# Patient Record
Sex: Male | Born: 1960 | Race: White | Hispanic: No | Marital: Married | Smoking: Current every day smoker
Health system: Southern US, Community
[De-identification: ages and names within clinical notes are randomized; demographics above are authoritative.]

## PROBLEM LIST (undated history)

## (undated) DIAGNOSIS — W19XXXA Unspecified fall, initial encounter: Secondary | ICD-10-CM

## (undated) DIAGNOSIS — I2510 Atherosclerotic heart disease of native coronary artery without angina pectoris: Secondary | ICD-10-CM

## (undated) DIAGNOSIS — K648 Other hemorrhoids: Secondary | ICD-10-CM

## (undated) DIAGNOSIS — Z91199 Patient's noncompliance with other medical treatment and regimen due to unspecified reason: Secondary | ICD-10-CM

## (undated) DIAGNOSIS — R569 Unspecified convulsions: Secondary | ICD-10-CM

## (undated) DIAGNOSIS — K3189 Other diseases of stomach and duodenum: Secondary | ICD-10-CM

## (undated) DIAGNOSIS — M545 Low back pain, unspecified: Secondary | ICD-10-CM

## (undated) DIAGNOSIS — R209 Unspecified disturbances of skin sensation: Secondary | ICD-10-CM

## (undated) DIAGNOSIS — R51 Headache: Secondary | ICD-10-CM

## (undated) DIAGNOSIS — F528 Other sexual dysfunction not due to a substance or known physiological condition: Secondary | ICD-10-CM

## (undated) DIAGNOSIS — F419 Anxiety disorder, unspecified: Secondary | ICD-10-CM

## (undated) DIAGNOSIS — IMO0002 Reserved for concepts with insufficient information to code with codable children: Secondary | ICD-10-CM

## (undated) DIAGNOSIS — R233 Spontaneous ecchymoses: Secondary | ICD-10-CM

## (undated) DIAGNOSIS — J309 Allergic rhinitis, unspecified: Secondary | ICD-10-CM

## (undated) DIAGNOSIS — R1013 Epigastric pain: Secondary | ICD-10-CM

## (undated) DIAGNOSIS — L219 Seborrheic dermatitis, unspecified: Secondary | ICD-10-CM

## (undated) DIAGNOSIS — R0789 Other chest pain: Secondary | ICD-10-CM

## (undated) DIAGNOSIS — K1120 Sialoadenitis, unspecified: Secondary | ICD-10-CM

## (undated) DIAGNOSIS — G4700 Insomnia, unspecified: Secondary | ICD-10-CM

## (undated) DIAGNOSIS — M76899 Other specified enthesopathies of unspecified lower limb, excluding foot: Secondary | ICD-10-CM

## (undated) DIAGNOSIS — F339 Major depressive disorder, recurrent, unspecified: Secondary | ICD-10-CM

## (undated) DIAGNOSIS — Z9119 Patient's noncompliance with other medical treatment and regimen: Secondary | ICD-10-CM

## (undated) DIAGNOSIS — M5412 Radiculopathy, cervical region: Secondary | ICD-10-CM

## (undated) DIAGNOSIS — G40909 Epilepsy, unspecified, not intractable, without status epilepticus: Secondary | ICD-10-CM

## (undated) DIAGNOSIS — F1010 Alcohol abuse, uncomplicated: Secondary | ICD-10-CM

## (undated) DIAGNOSIS — G510 Bell's palsy: Secondary | ICD-10-CM

## (undated) DIAGNOSIS — L723 Sebaceous cyst: Secondary | ICD-10-CM

## (undated) DIAGNOSIS — M999 Biomechanical lesion, unspecified: Secondary | ICD-10-CM

## (undated) DIAGNOSIS — R42 Dizziness and giddiness: Secondary | ICD-10-CM

## (undated) DIAGNOSIS — M6740 Ganglion, unspecified site: Secondary | ICD-10-CM

## (undated) DIAGNOSIS — I10 Essential (primary) hypertension: Secondary | ICD-10-CM

## (undated) DIAGNOSIS — M79609 Pain in unspecified limb: Secondary | ICD-10-CM

## (undated) DIAGNOSIS — F17200 Nicotine dependence, unspecified, uncomplicated: Secondary | ICD-10-CM

## (undated) HISTORY — DX: Sialoadenitis, unspecified: K11.20

## (undated) HISTORY — DX: Epigastric pain: R10.13

## (undated) HISTORY — DX: Radiculopathy, cervical region: M54.12

## (undated) HISTORY — DX: Pain in unspecified limb: M79.609

## (undated) HISTORY — DX: Dizziness and giddiness: R42

## (undated) HISTORY — DX: Unspecified disturbances of skin sensation: R20.9

## (undated) HISTORY — DX: Ganglion, unspecified site: M67.40

## (undated) HISTORY — DX: Reserved for concepts with insufficient information to code with codable children: IMO0002

## (undated) HISTORY — DX: Allergic rhinitis, unspecified: J30.9

## (undated) HISTORY — DX: Spontaneous ecchymoses: R23.3

## (undated) HISTORY — DX: Other sexual dysfunction not due to a substance or known physiological condition: F52.8

## (undated) HISTORY — DX: Other chest pain: R07.89

## (undated) HISTORY — DX: Epilepsy, unspecified, not intractable, without status epilepticus: G40.909

## (undated) HISTORY — DX: Sebaceous cyst: L72.3

## (undated) HISTORY — DX: Alcohol abuse, uncomplicated: F10.10

## (undated) HISTORY — DX: Biomechanical lesion, unspecified: M99.9

## (undated) HISTORY — DX: Patient's noncompliance with other medical treatment and regimen: Z91.19

## (undated) HISTORY — DX: Major depressive disorder, recurrent, unspecified: F33.9

## (undated) HISTORY — DX: Bell's palsy: G51.0

## (undated) HISTORY — PX: NECK SURGERY: SHX720

## (undated) HISTORY — DX: Other diseases of stomach and duodenum: K31.89

## (undated) HISTORY — DX: Headache: R51

## (undated) HISTORY — DX: Seborrheic dermatitis, unspecified: L21.9

## (undated) HISTORY — DX: Unspecified fall, initial encounter: W19.XXXA

## (undated) HISTORY — DX: Low back pain: M54.5

## (undated) HISTORY — DX: Nicotine dependence, unspecified, uncomplicated: F17.200

## (undated) HISTORY — DX: Low back pain, unspecified: M54.50

## (undated) HISTORY — DX: Patient's noncompliance with other medical treatment and regimen due to unspecified reason: Z91.199

## (undated) HISTORY — DX: Other hemorrhoids: K64.8

## (undated) HISTORY — DX: Other specified enthesopathies of unspecified lower limb, excluding foot: M76.899

---

## 1994-03-19 HISTORY — PX: CHOLECYSTECTOMY: SHX55

## 2007-03-20 DIAGNOSIS — I219 Acute myocardial infarction, unspecified: Secondary | ICD-10-CM

## 2007-04-07 ENCOUNTER — Ambulatory Visit: Payer: Self-pay | Admitting: Thoracic Surgery (Cardiothoracic Vascular Surgery)

## 2007-04-08 ENCOUNTER — Encounter (INDEPENDENT_AMBULATORY_CARE_PROVIDER_SITE_OTHER): Payer: Self-pay | Admitting: Cardiovascular Disease

## 2007-04-08 ENCOUNTER — Inpatient Hospital Stay (HOSPITAL_COMMUNITY): Admission: EM | Admit: 2007-04-08 | Discharge: 2007-04-14 | Payer: Self-pay | Admitting: Emergency Medicine

## 2007-04-08 ENCOUNTER — Encounter: Payer: Self-pay | Admitting: Thoracic Surgery (Cardiothoracic Vascular Surgery)

## 2007-04-11 HISTORY — PX: CORONARY ARTERY BYPASS GRAFT: SHX141

## 2007-04-17 HISTORY — PX: CARDIAC CATHETERIZATION: SHX172

## 2007-04-28 ENCOUNTER — Encounter: Admission: RE | Admit: 2007-04-28 | Discharge: 2007-04-28 | Payer: Self-pay | Admitting: Cardiovascular Disease

## 2007-05-09 ENCOUNTER — Encounter
Admission: RE | Admit: 2007-05-09 | Discharge: 2007-05-09 | Payer: Self-pay | Admitting: Thoracic Surgery (Cardiothoracic Vascular Surgery)

## 2007-05-09 ENCOUNTER — Ambulatory Visit: Payer: Self-pay | Admitting: Thoracic Surgery (Cardiothoracic Vascular Surgery)

## 2008-02-27 HISTORY — PX: COLONOSCOPY: SHX174

## 2008-07-17 HISTORY — PX: OTHER SURGICAL HISTORY: SHX169

## 2008-07-23 ENCOUNTER — Inpatient Hospital Stay (HOSPITAL_COMMUNITY): Admission: EM | Admit: 2008-07-23 | Discharge: 2008-07-29 | Payer: Self-pay | Admitting: Emergency Medicine

## 2009-10-01 IMAGING — CR DG CHEST 1V PORT
1 series · 1 of 1 positions shown · non-contrast
Comparison: Portable chest x-ray yesterday [id number].

CLINICAL DATA: Postop CABG. Chest tube removal.

PORTABLE CHEST - 1 VIEW  [DATE]/3110 1011 hours:

[view not recorded]
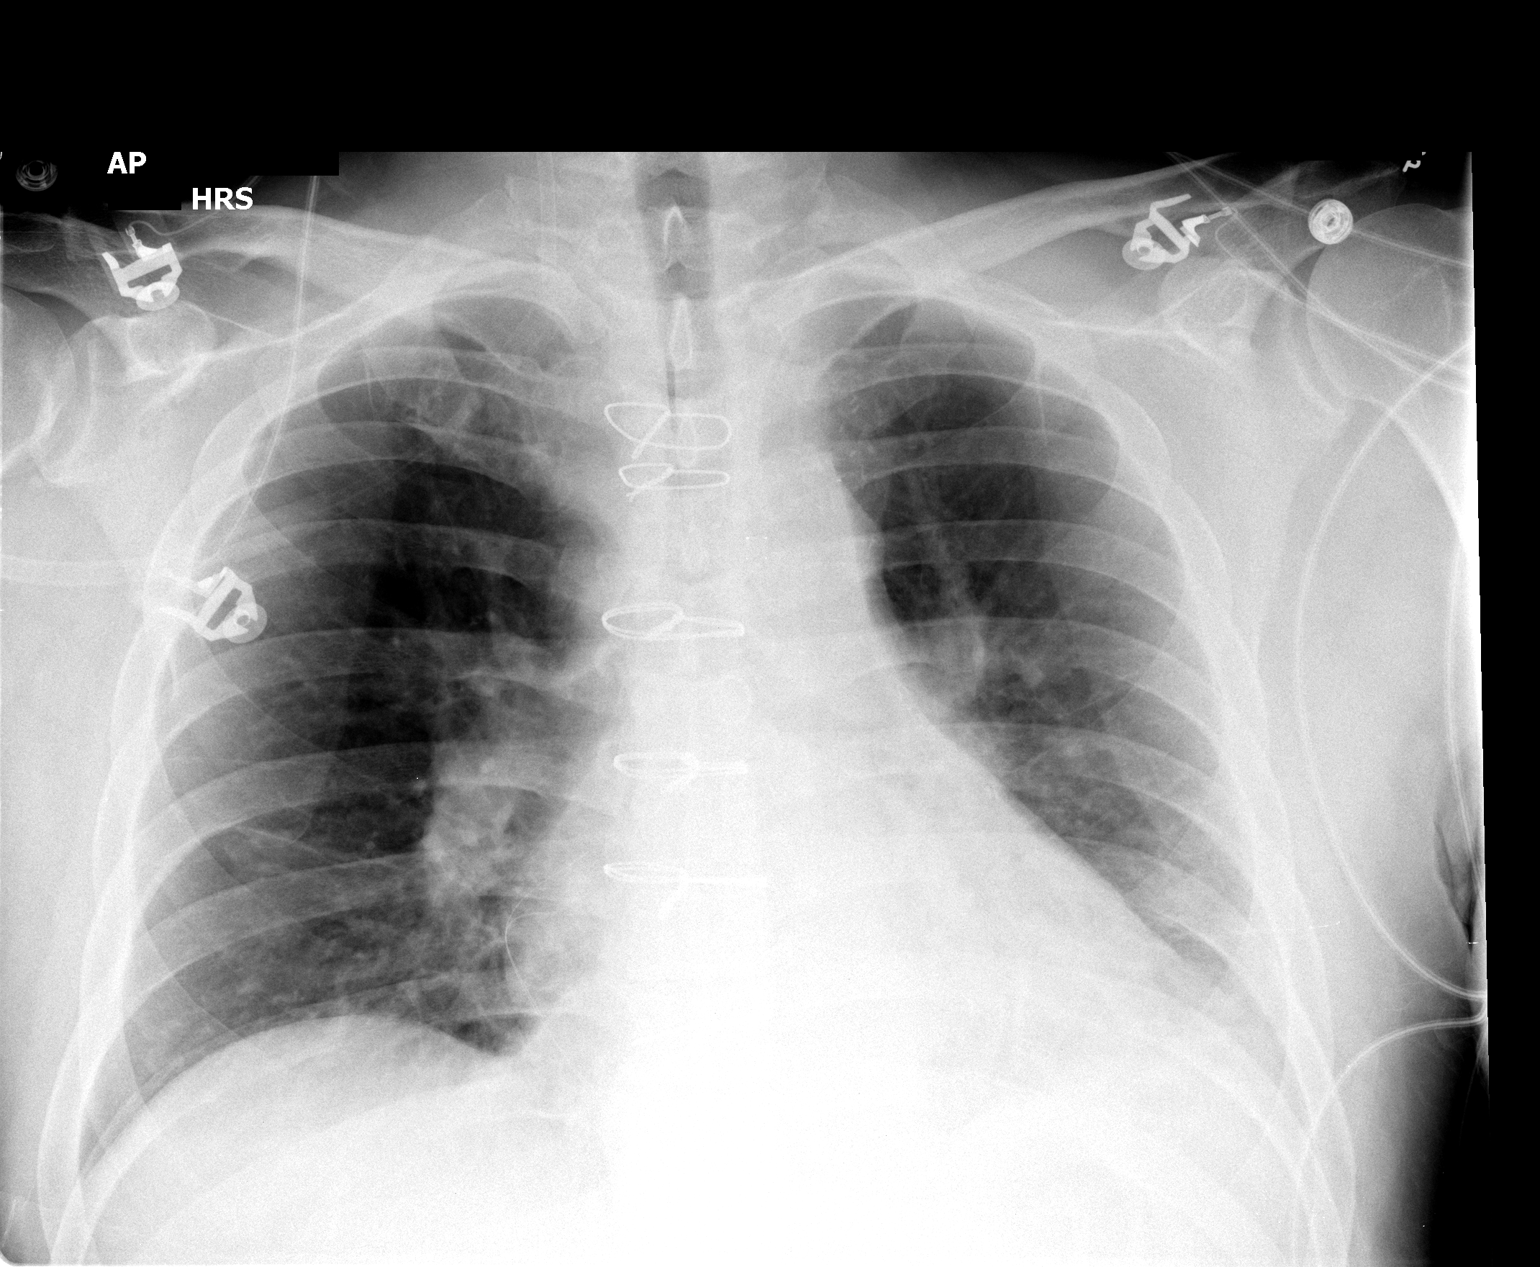

[1 of 1 positions shown; findings below may reference images not displayed]

FINDINGS: Left chest tube removed with no pneumothorax. Swan-Ganz catheter
removed. Epicardial pacing wires remain. Heart mildly enlarged but stable.
Slight worsening of left lower lobe atelectasis, still mild. Pulmonary
parenchyma otherwise clear. Pulmonary vascularity normal.
IMPRESSION: No pneumothorax after left chest tube removal. Worsening left lower lobe
aeration, though atelectasis still mild in degree.

## 2010-06-27 LAB — COMPREHENSIVE METABOLIC PANEL
Alkaline Phosphatase: 54 U/L (ref 39–117)
BUN: 8 mg/dL (ref 6–23)
CO2: 23 mEq/L (ref 19–32)
Chloride: 103 mEq/L (ref 96–112)
Creatinine, Ser: 0.99 mg/dL (ref 0.4–1.5)
GFR calc non Af Amer: >60 mL/min (ref >60)
Glucose, Bld: 146 mg/dL — ABNORMAL HIGH (ref 70–99)
Potassium: 3.6 mEq/L (ref 3.5–5.1)
Total Bilirubin: 0.6 mg/dL (ref 0.3–1.2)

## 2010-06-27 LAB — BASIC METABOLIC PANEL
BUN: 7 mg/dL (ref 6–23)
BUN: 9 mg/dL (ref 6–23)
CO2: 30 mEq/L (ref 19–32)
CO2: 32 mEq/L (ref 19–32)
Calcium: 9.5 mg/dL (ref 8.4–10.5)
Calcium: 9.5 mg/dL (ref 8.4–10.5)
Chloride: 101 mEq/L (ref 96–112)
Chloride: 99 mEq/L (ref 96–112)
Creatinine, Ser: 0.86 mg/dL (ref 0.4–1.5)
GFR calc Af Amer: >60 mL/min (ref >60)
GFR calc non Af Amer: >60 mL/min (ref >60)
Glucose, Bld: 108 mg/dL — ABNORMAL HIGH (ref 70–99)
Glucose, Bld: 109 mg/dL — ABNORMAL HIGH (ref 70–99)
Glucose, Bld: 111 mg/dL — ABNORMAL HIGH (ref 70–99)
Potassium: 3.7 mEq/L (ref 3.5–5.1)
Sodium: 138 mEq/L (ref 135–145)

## 2010-06-27 LAB — CBC
HCT: 38.5 % — ABNORMAL LOW (ref 39.0–52.0)
HCT: 40.2 % (ref 39.0–52.0)
HCT: 40.5 % (ref 39.0–52.0)
HCT: 40.6 % (ref 39.0–52.0)
Hemoglobin: 13.6 g/dL (ref 13.0–17.0)
Hemoglobin: 14.1 g/dL (ref 13.0–17.0)
MCHC: 34.5 g/dL (ref 30.0–36.0)
MCHC: 34.8 g/dL (ref 30.0–36.0)
MCHC: 35.2 g/dL (ref 30.0–36.0)
MCHC: 35.2 g/dL (ref 30.0–36.0)
MCV: 89.5 fL (ref 78.0–100.0)
MCV: 90.1 fL (ref 78.0–100.0)
Platelets: 177 10*3/uL (ref 150–400)
Platelets: 188 10*3/uL (ref 150–400)
RBC: 4.3 MIL/uL (ref 4.22–5.81)
RDW: 13.2 % (ref 11.5–15.5)
RDW: 13.3 % (ref 11.5–15.5)
RDW: 13.3 % (ref 11.5–15.5)
RDW: 13.4 % (ref 11.5–15.5)

## 2010-06-27 LAB — LIPID PANEL
HDL: 29 mg/dL — ABNORMAL LOW (ref >39)
LDL Cholesterol: 152 mg/dL — ABNORMAL HIGH (ref 0–99)
Total CHOL/HDL Ratio: 8 RATIO
VLDL: 52 mg/dL — ABNORMAL HIGH (ref 0–40)

## 2010-06-27 LAB — APTT: aPTT: 25 seconds (ref 24–37)

## 2010-06-27 LAB — CK TOTAL AND CKMB (NOT AT ARMC)
CK, MB: 1.9 ng/mL (ref 0.3–4.0)
CK, MB: 11.5 ng/mL — ABNORMAL HIGH (ref 0.3–4.0)
Relative Index: 1.2 (ref 0.0–2.5)
Relative Index: 1.4 (ref 0.0–2.5)

## 2010-06-27 LAB — DIFFERENTIAL
Basophils Absolute: 0 10*3/uL (ref 0.0–0.1)
Basophils Relative: 1 % (ref 0–1)
Eosinophils Absolute: 0.1 10*3/uL (ref 0.0–0.7)
Neutro Abs: 5.2 10*3/uL (ref 1.7–7.7)
Neutrophils Relative %: 78 % — ABNORMAL HIGH (ref 43–77)

## 2010-06-27 LAB — PROTIME-INR: INR: 1 (ref 0.00–1.49)

## 2010-06-27 LAB — POCT CARDIAC MARKERS: Troponin i, poc: <0.05 ng/mL (ref 0.00–0.09)

## 2010-08-01 NOTE — Cardiovascular Report (Signed)
NAMEWILLIA, SEMAN            ACCOUNT NO.:  [id number]   MEDICAL RECORD NO.:  [id number]          PATIENT TYPE:  INP   LOCATION:  [id number]                       FACILITY:  [facility]   PHYSICIAN:  Birdie Riddle, M.D.  DATE OF BIRTH:  03-05-1961   DATE OF PROCEDURE:  04/08/2007  DATE OF DISCHARGE:                            CARDIAC CATHETERIZATION   PROCEDURE DONE BY:  Dixie Dials, MD   PROCEDURE:  1. Left heart catheterization.  2. Selective coronary angiography.  3. Left ventricular function study.   INDICATIONS:  This 50 year old white male with multiple cardiac risk  factors including diabetes, hypertension and smoking, had typical chest  pain without EKG changes of ischemia.   APPROACH:  Right femoral artery using 4-French sheath and catheters.   DYE USED:  About 48 mL of dye were used.   COMPLICATIONS:  None, accept clot in the sheath; post removal, femoral  and distal pulses were normal.   HEMODYNAMIC DATA:  The left ventricular pressure was 158/15 and aortic  pressure was 161/91.   LEFT VENTRICULOGRAM:  The left ventriculogram was done, but not  recorded; it showed a mild generalized hypokinesia with ejection  fraction of 45%.   CORONARY ANATOMY:  The left main coronary artery showed luminal  irregularities and calcification followed by a distal eccentric 60% to  70% lesion.   Left anterior descending coronary artery:  The left anterior descending  coronary artery also showed proximal calcification and had proximal  luminal irregularities followed by 50% to 60% lesion near first septal  perforator branch and a midvessel 70% to 80% lesion and a distal 80% to  90% lesion near the apex of the heart.  The diagonal #1 showed ostial  70% and gradual tapering.  Diagonal #2 showed ostial 80%, then diffuse  narrowing.  Diagonal #3 and #4 were unremarkable, but small vessels.   Left circumflex coronary artery:  The left circumflex coronary artery  showed ostial eccentric  50% lesion, then gradual narrowing.  The obtuse  marginal branch #1 was a very small vessel.  Obtuse marginal branch #2  was a large vessel with a proximal 30% stenosis, then it bifurcated into  superior and inferior branch.  The superior branch had ostial 40%, then  distal diffuse disease, and the inferior branches had proximal 80% to  90% lesion, then distal diffuse disease.   Right coronary artery:  The right artery was dominant and had an ostial  20%, proximal 30% followed by 70% to 80% beaded lesion and in the  proximal to mid vessel junction and a distal 50% to 60% lesion.  The  posterolateral branch had a total occlusion, then it bifurcated into  superior and inferior branch that filled retrograde from collaterals  from left circumflex coronary artery and from collaterals from posterior  descending coronary artery.  The posterior descending coronary artery  had mild diffuse proximal and mid vessel disease and with moderate  diffuse distal disease.   IMPRESSION:  1. Severe multivessel native vessel coronary artery disease.  2. Mild left ventricular systolic dysfunction.   RECOMMENDATIONS:  This patient will be referred to cardiovascular  thoracic  surgeon for coronary artery bypass graft surgery and he will  have 2-D echocardiogram for left ventricular and right ventricular  function.      Birdie Riddle, M.D.  Electronically Signed     ASK/MEDQ  D:  04/08/2007  T:  04/08/2007  Job:  [id number]

## 2010-08-01 NOTE — Op Note (Signed)
NAMEHELMER, SLOVER            ACCOUNT NO.:  [id number]   MEDICAL RECORD NO.:  [id number]          PATIENT TYPE:  INP   LOCATION:  3729                         FACILITY:  [facility]   PHYSICIAN:  Marchia Meiers. Vertell Limber, M.D.  DATE OF BIRTH:  [birth year]   DATE OF PROCEDURE:  07/28/2008  DATE OF DISCHARGE:                               OPERATIVE REPORT   PREOPERATIVE DIAGNOSES:  Herniated cervical disk with spondylosis,  degenerative disk disease, stenosis, cervical radiculopathy C5-6 and C6-  7 level.   POSTOPERATIVE DIAGNOSES:  Herniated cervical disk with spondylosis,  degenerative disk disease, stenosis, cervical radiculopathy C5-6 and C6-  7 level.   PROCEDURE:  Anterior cervical decompression and fusion C5-6 and C6-7  levels with allograft bone graft, morselized bone autograft, EquivaBone,  and anterior cervical plate.   SURGEON:  Marchia Meiers. Vertell Limber, MD   ASSISTANT:  Ophelia Charter, MD   ANESTHESIA:  General endotracheal anesthesia.   ESTIMATED BLOOD LOSS:  50 mL.   COMPLICATIONS:  None.   DISPOSITION:  Recovery.   INDICATION:  Frank Carey is 50 year old man, who was admitted with  chest pain and left arm pain, who was found to have a large disk  herniation at C6-7 on the left and cervical stenosis and spondylosis at  C5-6 and was elected to take him to surgery for anterior cervical  decompression and fusion at these affected levels.   PROCEDURE:  Mr. Hoard was brought to the operating room.  Following  satisfactory uncomplicated induction of general endotracheal anesthesia  and placement of intravenous lines, the patient was placed in supine  position on the operating table.  Neck was placed in slight extension.  He was placed in 5 pounds halter traction.  His anterior neck was then  prepped and draped in usual sterile fashion.  The area of planned  incision was infiltrated with local lidocaine.  Incision was made on the  left side midline carried through platysma  layer.  Subplatysmal  dissection was performed exposing the anterior border of  sternocleidomastoid muscle using blunt dissection.  The carotid sheath  was kept lateral, trachea and esophagus kept medial exposing the  anterior cervical spine.  A bent spinal needle was placed and it was  felt to be the C5-6 level and this was confirmed on intraoperative x-  ray.  Using electrocautery and Key elevator, the longus colli muscles  were taken down from the anterior cervical spine from C5 through C7  levels and self-retaining shadow line retractor along with up and down  retractor was placed to facilitate exposure of the interspace.  C5-6 and  C6-7 was then incised and disk material was removed in piecemeal  fashion.  Distraction pins were placed at C6 and C7, and using  intraoperative microscope, the endplates were decorticated with a high-  speed drill and uncinate spurs were drilled down.  Posterior  longitudinal ligament was removed in piecemeal fashion with a large  amount of disk herniation at C6-7 on the left causing significant left  C7 nerve root compression.  The nerve root was fully decompressed.  It  extended out the neuroforamen  and the right-sided nerve root was also  decompressed after trial sizing a 7-mm allograft bone wedge was  selected, fashioned with high-speed drill, packed with morselized bone  autograft and EquivaBone, which was inserted in the interspace and  countersunk appropriately.  Attention was then turned to the C5-6 level  where similar decompression was performed.  Both nerve roots were  decompressed as well as the central spinal cord dura.  Hemostasis was  assured and similarly sized bone graft was fashioned with high-speed  drill pack with morselized bone autograft and EquivaBone inserted in the  interspace and countersunk appropriately.  A 34-mm Trestle anterior  cervical plate was affixed to the anterior cervical spine using variable  angle 14-mm screws, two  at C5 and two at C6, and two at C7.  All screws  had excellent purchase.  The traction weight was removed prior to  placing the plate.  All screws were locked down in situ and their final  x-ray demonstrated the superior aspect of the construct.  It appeared to  be well positioned.  It was not possible to visualize the lower aspect  because of the patient's large size.  Hemostasis was assured.  Soft  tissues were inspected and found to be good repair.  The platysma layer  was closed with 3-0 Vicryl sutures.  Skin edges were approximated with 3-  0 Vicryl subcuticular stitch.  The wound was dressed with Dermabond.  The patient was extubated in the operating room and taken to the  recovery room in stable satisfactory condition having tolerated the  operation well.  Counts were correct at the end of the case.      Marchia Meiers. Vertell Limber, M.D.  Electronically Signed     JDS/MEDQ  D:  07/28/2008  T:  07/29/2008  Job:  [id number]

## 2010-08-01 NOTE — Discharge Summary (Signed)
[name], [name]            ACCOUNT NO.:  [id number]   MEDICAL RECORD NO.:  [id number]          PATIENT TYPE:  INP   LOCATION:  2030                         FACILITY:  [facility]   PHYSICIAN:  Revonda Standard. Roxan Hockey, M.D.DATE OF BIRTH:  Dec 14, 1960   DATE OF ADMISSION:  04/07/2007  DATE OF DISCHARGE:                               DISCHARGE SUMMARY   FINAL DIAGNOSIS:  Left main and three-vessel coronary artery disease  with progressive angina.   IN-HOSPITAL DIAGNOSES:  1. Acute blood loss anemia postoperatively.  2. Volume overload postoperatively.   SECONDARY DIAGNOSES:  1. History of epilepsy.  2. Dyslipidemia.  3. Type 2 diabetes mellitus, not treated with medication.  4. Hypertension.  5. Tobacco abuse.  6. Marijuana abuse.  7. 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   IN-HOSPITAL OPERATIONS AND PROCEDURES:  1. Cardiac catheterization with a left heart catheterization,      selective coronary angiography, left ventricular function studies.  2. Coronary artery bypass grafting x4 using a left internal mammary      artery to left anterior descending, saphenous vein graft to obtuse      marginals one and two, saphenous vein graft to posterior      descending.  3. Endoscopic vein harvest from right leg.   HISTORY AND PHYSICAL AND HOSPITAL COURSE:  Mr. Frank Carey is a 50 year old  gentleman with multiple cardiac risk factors who presents with crescendo  angina.  He ruled in for non-Q-wave myocardial infarction and at  catheterization he had left main and three-vessel coronary artery  disease with impaired left ventricular function.  Coronary artery bypass  grafting was advised.  The patient was seen and evaluated by Dr.  Roxan Hockey.  Dr. Roxan Hockey discussed with the patient undergoing  coronary artery bypass grafting.  He discussed risks and benefits with  the patient.  The patient acknowledged understanding and agreed to  proceed.  Surgery was scheduled for April 11, 2007.  Preoperatively,  the patient underwent preoperative carotid Duplex ultrasound showing no  significant ICA stenosis.  The patient remained stable preoperatively.  For details of the patient's past medical history and physical exam,  please see dictated H&P.   The patient was taken to the operating room April 11, 2007, where he  underwent coronary artery bypass grafting x4 using a left internal  mammary artery to left anterior descending, saphenous vein graft to  obtuse marginals one and two, saphenous vein graft to posterior  descending.  Endoscopic vein harvest from right leg was done.  The  patient tolerated this procedure well and was transferred to the  intensive care unit in stable condition.  Postoperatively, the patient  was noted to be hemodynamically stable.  He was extubated following  surgery.  Post extubation, the patient was noted to be alert and  oriented  x4.  Neuro intact.  Post extubation, the patient was placed on  nasal cannula.  His sats were greater than 90% on 2 liters.  Follow-up  chest x-ray day #1 showed to be clear.  The patient had minimal drainage  from chest tubes and chest tubes were discontinued in normal  fashion.  Repeat chest x-ray done the following morning remained stable with no  pneumothorax or effusions.  He was eventually able to be weaned off  oxygen sating greater than 90% on room air.  The patient continued to  use his incentive spirometer during his hospital stay.  Postoperatively,  the patient was noted to be in normal sinus rhythm.  He was able to be  weaned from all drips and heart rate and blood pressure were stable.  Swan-Ganz catheter discontinued in normal fashion.  He remained in  normal sinus rhythm and was restarted on low-dose beta blocker.  The  patient tolerated this well.  Prior to discharge home, the patient was  in normal sinus rhythm in the 70s.  The patient did have slight acute  blood loss anemia postoperatively.   He did not require any transfusions.  He was asymptomatic and this was monitored.  It was stable prior to  discharge home.  The patient also had some volume overload  postoperatively.  He was started on diuretics.  The patient's weights  were checked daily and was back near his baseline weight prior to  discharge home.  He had mild peripheral edema noted and was 2.6 kg above  his preoperative weight prior to discharge.  Plan to continue diuretics  for several more days as an outpatient.  Postoperatively, cardiac rehab  was working well with the patient.  The patient was progressing well and  was ambulating on his own prior to discharge.  He was tolerating diet  well.  No nausea or vomiting noted.   Postop day #3, the patient's vital signs were noted to be stable.  He is  afebrile.  His sats were greater than 90% on room air.  The patient was  in normal sinus rhythm.  Pulmonary status was stable.  All incisions  were clean, dry and intact and healing well.  The patient had labs  obtained postop day #2, showing white count of 11, hemoglobin of 10.2,  hematocrit 38, platelet count 145.  Sodium of 134, potassium 3.8,  chloride of 101, bicarb of 28, BUN 14 current of 1.2, glucose of 96.  The patient is tentatively ready for discharge home today, April 14, 2007, postop day #3 in the afternoon pending he remained stable.   FOLLOW-UP APPOINTMENTS:  Follow-up appointment has been arranged with  Dr. Roxan Hockey for May 09, 2007 at 11:45 a.m.  The patient will  need to obtain PA and lateral chest x-ray 30 minutes prior to this  appointment.  The patient will need to follow up with Dr. Doylene Canard in two  weeks.  He may contact Dr. Merrilee Jansky office to make these arrangements.   ACTIVITY:  Patient instructed no driving until released to do so, no  heavy lifting over 10 pounds.  He is told to ambulate two to four times  per day, progress as tolerated and continue his breathing exercises.    INCISIONAL CARE:  The patient is told to shower, washing his incisions  using soap and water.  He is to contact the office if he develops any  drainage or opening from any of his incision sites.   DIET:  The patient educated on diet to be low-fat, low-salt.   DISCHARGE MEDICATIONS:  1. Aspirin 325 mg daily.  2. Lopressor 25 mg b.i.d.  3. Lipitor 40 mg at night.  4. Paxil 20 mg daily.  5. Depakote 500 mg b.i.d.  6. Lasix 40 mg daily x5 days.  7. Potassium chloride 20 mEq daily x5 days.  8. Klonopin 1 mg b.i.d.  9. Albuterol inhaler as used at home.  10.Oxycodone 5 mg 1-2 tablets q.4-6h. p.r.n.      Darlin Coco, PA      Revonda Standard. Roxan Hockey, M.D.  Electronically Signed    KMD/MEDQ  D:  04/14/2007  T:  04/14/2007  Job:  [id number]   cc:   Birdie Riddle, M.D.

## 2010-08-01 NOTE — Assessment & Plan Note (Signed)
OFFICE VISIT   KHYLER, BRUNSWICK  DOB:  [birth year]                                        May 09, 2007  CHART #:  [id number]   HISTORY OF PRESENT ILLNESS:  Mr. Hoovler is a 50 year old gentleman who  presented in January with progressive angina.  At catheterization, he  had severe three-vessel disease with diffusely diseased targets.  He  underwent coronary artery bypass grafting x 4 on April 11, 2007.  At  that time, he had good quality conduits.  His coronaries were diffusely  diseased, but were reasonable vessels at the site of the anastomoses.  Postoperatively, he did well.  He did not have any significant  complications.  He went home on postoperative day number 3.   Since that time, he has been doing well.  He has had some incisional  pain.  He is taking 5-6 pain pills daily, usually 2 in the morning, 1-2  during the course of the day, then 2 at night before he goes to bed.  He  was initially taking oxycodone.  He is now taking Lortab.  He has not  been taking his Depakote because of financial issues.  He has applied  for Medicaid, but has not heard back from them yet.  He was given  samples of Lipitor, but has not been taking them because of concerns  about myalgias.  He had a chest x-ray done on April 28, 2007 which  showed some small right pleural effusions.  He is noting that he has had  appetite problems, sleep disturbance and has lost about 15 pounds since  he left the hospital.  He says that his appetite and sleep have improved  a little over the last several days.   PHYSICAL EXAMINATION:  GENERAL:  Mr. Dinsdale is a 50 year old white  male in no acute distress.  NEUROLOGIC:  He is at baseline.  He is just a little slow with his  responses with very minimally slurred speech.  VITAL SIGNS:  Blood pressure 125/83, pulse 58, respirations 18, oxygen  saturation is 96% on room air.  LUNGS:  Equal breath sounds bilaterally.  There are  no rales or  wheezing.  CARDIAC:  Regular rate and rhythm.  Normal S1 and S2.  There is no rub  or murmur.  His sternal incision is clean, dry, intact and healing well.  There is  no clicking or popping.  EXTREMITIES:  His leg incisions are healing well.  He has no peripheral  edema.   DIAGNOSTICS:  Chest x-ray shows a tiny right pleural effusion, possibly  slightly bigger than the film from  April 28, 2007.   IMPRESSION:  Mr. Della is a 50 year old gentleman.  He is status post  coronary bypass grafting.  I had a long discussion with him.  From the  standpoint of his surgery, he is doing well.  His wounds are healing  nicely.  He has had no recurrent anginal-type symptoms.  He does have a  very minimal right pleural effusion for which I am going to give him a  steroid taper.  I do not think he needs any specific followup unless he  were to become symptomatic.  We did discuss pain medication utilization.  He is using about 40 pain pills a week.  I do not think that  is  excessive in his case, but I did encourage him to start cutting back and  trying to use Tylenol for times when he is just sore and then using the  narcotics for episodes when he has more severe pain.  I gave him a  tentative goal of being off of the narcotics over the next 4 weeks.   I did encourage him to take Lipitor.  We did discuss the possibility of  liver enzyme abnormalities, myalgias or arthralgias.  He can stop the  medication if that occurs.  I just think at his young age with diffusely  diseased coronaries, it can be very important for him to be on a statin  if he can tolerate it.  He will continue to be followed by Dr. Doylene Canard.  I would be happy to see him back at any time if I can be of any further  assistance with his care.   Revonda Standard Roxan Hockey, M.D.  Electronically Signed   SCH/MEDQ  D:  05/09/2007  T:  05/10/2007  Job:  [id number]   cc:   Birdie Riddle, M.D.

## 2010-08-01 NOTE — Op Note (Signed)
NAMEARVEY, LADER            ACCOUNT NO.:  [id number]   MEDICAL RECORD NO.:  [id number]          PATIENT TYPE:  INP   LOCATION:  2303                         FACILITY:  [facility]   PHYSICIAN:  Revonda Standard. Roxan Hockey, M.D.DATE OF BIRTH:  09-07-1960   DATE OF PROCEDURE:  04/11/2007  DATE OF DISCHARGE:                               OPERATIVE REPORT   PREOPERATIVE DIAGNOSIS:  Left main and 3-vessel coronary disease with  progressive angina.   POSTOPERATIVE DIAGNOSIS:  Left main and 3-vessel coronary disease with  progressive angina.   PROCEDURE:  1. Median sternotomy.  2. Extracorporeal circulation.  3. Coronary artery bypass grafting x4 (left internal mammary artery to      the left anterior descending, saphenous vein graft to obtuse      marginals one and two, saphenous vein graft to posterior      descending).  4. Endoscopic vein harvest right leg.   SURGEON:  Revonda Standard. Roxan Hockey, M.D.   ASSISTANT:  Suzzanne Cloud, P.A.   ANESTHESIA:  General.   FINDINGS:  Coronary is diffusely diseased, but all of good quality at  the site of the anastomoses.  Conduit is excellent quality.  Ramus/optional diagonal vessel too small to graft.   CLINICAL NOTE:  Mr. Keto is a 50 year old gentleman with multiple  cardiac risk factors who presents with crescendo angina.  He ruled in  for a non-Q-wave myocardial infarction and at catheterization he had  left main and 3-vessel coronary disease with impaired left ventricular  function.  Coronary bypass grafting was advised.  The patient was  informed of the indications, risks, benefits, and alternative  treatments.  He understood and accepted the risks that are outlined in  the consultation note.  He accepted the risks and agreed to proceed.   OPERATIVE NOTE:  Mr. Konefal was brought to the preop holding area on  April 11, 2007. Lines were placed by anesthesia for monitoring  arterial, central venous, and pulmonary arterial pressure.   Intravenous  antibiotics were administered.  The patient was taken to the operating  room, anesthetized, and intubated.  A Foley catheter was placed in the  chest, abdomen and legs were prepped and draped in the usual sterile  fashion.   The patient had declined the use of the left radial graft.  Given his  type 2 diabetes, it was felt that it would not be wise to use a right  mammary graft.  An incision was made in the medial aspect of the right  leg at the level of the knee, the greater saphenous vein was harvested  from the right leg endoscopically.  It was an excellent quality vessel.  Simultaneously a median sternotomy was performed, and the left internal  mammary artery was harvested using standard technique; likewise, it was  a good quality vessel.  Then 5000 units of heparin was administered, and  the vessel harvester, and the remainder of the full heparin dose was  given prior to dividing the distal end of the left mammary artery.   The pericardium was opened.  The ascending aorta was inspected.  There  was no atherosclerotic  disease.  The aorta was cannulated via concentric  2-0 Ethibond pledgeted pursestring sutures.  A dual-stage venous cannula  was placed in the via pursestring suture in the right atrial  appendage.  Cardiopulmonary bypass was instituted, and the patient's cooled to 32  degrees Celsius.  The coronary arteries were inspected and anastomotic  sites were chosen.  The conduits were inspected and cut to length.  A  foam pad was placed in the pericardium to protect the left phrenic  nerve.  A temperature probe was placed in the myocardial septum.  A  cardioplegic cannula was placed in the ascending aorta.  The aorta was  cross clamped, the left ventricle was emptied via the aortic root vent.  Cardiac arrest then was achieved with a combination of cold antegrade  blood cardioplegia and topical iced saline.  After achieving a complete  diastolic arrest and adequate  myocardial septal cooling, the following  distal anastomoses were performed.   First a reverse saphenous vein graft was placed end-to-side to the  posterior descending branch of the right coronary.  This vessel and all  of the other coronaries did have diffuse plaquing within them; but the  vessel was of good quality at the site of the anastomosis, as were the  remaining targets.  There was a 1.5 mm vessel.  The vein graft was  anastomosed end-to-side with a running 7-0 Prolene suture.  There was  excellent flow through the graft.  Cardioplegia was administered, and  there was good hemostasis.   Next, a reverse saphenous vein graft was placed sequentially to obtuse  marginals one and two.  Obtuse marginal one was a 1.5 mm vessel.  Obtuse  marginal two was a 2 mm vessel.  Both were free of disease at the  anastomosis.  Both anastomoses were performed with running 7-0 Prolene  sutures.  Both were probed proximally and distally at their completion.  There was, again, excellent flow through this graft and good hemostasis.   Next, the left internal mammary artery was brought through a window in  the pericardium.  The distal end was beveled.  The mammary was a large,  good quality vessel with excellent flow.  It was anastomosed end-to-side  to the distal LAD.  In the LAD there was some plaquing just proximal to  the anastomosis, but a 1.5 mm probe did pass.  The mammary, itself, was  a 2.5 mm vessel, and the LAD was a 2 mm vessel.  The anastomosis was  performed with a running 8-0 Prolene suture.  At the completion of the  mammary to the LAD anastomosis, the bulldog clamp was briefly removed to  inspect for hemostasis.  Immediate rapid septal rewarming was noted.  The bulldog clamp was replaced and the mammary pedicle was tacked to the  epicardial surface of the heart with 6-0 Prolene sutures.   Additional cardioplegia was administered down the vein grafts.  There  were cut to length.  The  cardioplegic cannula was removed from the  ascending aorta, and proximal vein graft anastomoses were performed to  4.5 mm punch aortotomies with running 6-0 Prolene sutures.  At the  completion of the final proximal anastomosis, the patient was placed in  Trendelenburg position.  Lidocaine was administered and the bulldog  clamps removed from the mammary artery.  Once again, the aortic root was  de-aired and the aortic crossclamp was removed.  The total crossclamp  time was 65 minutes.   While the patient was  being rewarmed, all proximal and distal  anastomoses were inspected for hemostasis.  The patient spontaneously  resumed sinus rhythm and did not require defibrillation.  Epicardial  pacing wires were placed on the right ventricle and right atrium, and  DDD pacing was initiated, and the patient had a slow sinus rhythm.  When  the patient had rewarmed to a core temperature of 37 degrees Celsius, he  was weaned from cardiopulmonary bypass on the first attempt.  The total  bypass time was 103 minutes.  The initial cardiac index was greater than  2 liters per minute per meter squared; and he remained hemodynamically  stable, throughout the post bypass period.   Test dose protamine was administered as well as tolerated.  The atrial  and aortic cannulae were removed.  The remaining of the protamine was  administered without incident.  The chest was irrigated with 1 liter of  warm normal saline containing 1 gram of vancomycin.  Hemostasis was  achieved.  The pericardium was reapproximated with interrupted 3-0 silk  sutures.  It came together easily without tension.  A left pleural and 2  mediastinal chest tubes were placed through separate subcostal  incisions.  The sternum was closed with a combination of single-and-  double heavy gauge stainless steel wires.  The pectoralis fascia and  subcutaneous tissue and skin were closed in standard  fashion.  OnQue local anesthetic catheters were  placed in a subpectoral  position, bilaterally; and infusion of 0.25% Marcaine was initiated.  All sponge, needle, and instruments counts were correct at the end of  the procedure; and the patient was transported from the operating room  to the surgical intensive care unit in good condition.      Revonda Standard Roxan Hockey, M.D.  Electronically Signed     SCH/MEDQ  D:  04/11/2007  T:  04/11/2007  Job:  [id number]   cc:   Birdie Riddle, M.D.

## 2010-08-01 NOTE — Consult Note (Signed)
Frank Carey, Frank Carey            ACCOUNT NO.:  [id number]   MEDICAL RECORD NO.:  [id number]          PATIENT TYPE:  INP   LOCATION:  3729                         FACILITY:  [facility]   PHYSICIAN:  Legrand Como L. Reynolds, M.D.DATE OF BIRTH:  1960/12/16   DATE OF CONSULTATION:  07/23/2008  DATE OF DISCHARGE:                                 CONSULTATION   REASON FOR EVALUATION:  Seizure.   HISTORY OF PRESENT ILLNESS:  This is the initial inpatient consultation  evaluation of this 50 year old male with a past medical history which  includes coronary artery disease and epilepsy with very rare generalized  seizures.  The patient has had slightly worsening pain in the left arm  over the last few weeks.  For the last week, it has been severe enough  that it has interfered with his ability to sleep and has been constant  and unrelenting.  For the last couple of days, he has also developed  some pain in the chest, which is fairly persistent.  He has seen his  primary care Dr. Rodena Piety who prescribed him some Mobic and Tramadol.  He  started taking this about 5 days ago.  Today, he would see a  chiropractor, who declined to perform any interventions because his  blood pressure was elevated.  He came to the emergency room, because of  his chest pain and history of coronary disease and CABG, was admitted  for further workup.  While awaiting he has been on the floor, he  experienced a witnessed generalized seizure.  His wife said that he  raised both hands up in the air, and then he knew was coming, and then  had a generalized convulsion lasting about 1-1/2 to 2 minutes.  Afterwards, he was fairly confused about 20 minutes before coming close  to baseline.  He received some sedative medications, and at this point,  he is a little groggy.  His wife states that he had lot of trouble with  seizures as teenager, in his adult life he has only had 3, usually in  the setting of sleep deprivation.  Neurology  consultation is requested.   PAST MEDICAL HISTORY:  Remarkable for the epilepsy as above.  He states  the last time he saw a neurologist was about 10 years ago in [facility].  He does not recall the name.  He has been on Klonopin in the past.  He  reports a history of adverse reactions to several anticonvulsants,  including Dilantin, Depakote, and Keppra.  He has a history of coronary  artery disease with bypass grafting in January 2009 by Dr. Roxan Hockey.  Other medical problems include hypertension, diabetes, dyslipidemia.  He  has a history of Bell palsy with some residual facial weakness on the  left.   FAMILY/SOCIAL/REVIEW OF SYSTEMS:  Per admission H and P and admission  nursing record.   MEDICATIONS:  His medications include aspirin, clonidine, lisinopril,  metoprolol, Paxil 20 mg daily, Crestor.  In the hospital, he is also  receiving Depakote, heparin, p.r.n. morphine, and nitroglycerin.   PHYSICAL EXAMINATIONS:  VITAL SIGNS:  Temperature 97.9, blood pressure  177/113, heart rate 96, respirations 20.  GENERAL:  This is a healthy-appearing man, supine in hospital bed, in no  evident distress.  HEENT:  Head:  Cranium is normocephalic and atraumatic.  Oropharynx is  benign.  NECK:  Supple without carotid or supraclavicular bruits.  HEART:  Regular rate and rhythm without murmurs.  NEURO:  Mental status; he is somewhat groggy and not quite fully alert.  He is, however, able to answer questions, follow 1-2 step commands,  repeat phrases.  He is oriented to time and place.  Mood is euthymic and  affect appropriate.  Cranial nerves; pupils are equal and reactive.  Extraocular movements are full without nystagmus.  Visual fields full to  confrontation.  Face, tongue, and palate move normally and  symmetrically.  Motor; normal bulk and tone.  On examination, he has  definite weakness of the left triceps, finger extensors, and to a lesser  extent wrist extensors.  Otherwise, full  strength throughout.  Sensory;  reports diminished pinprick sensation over the left little finger,  otherwise intact pinprick and light touch throughout.  Coordination;  finger-to-nose performed accurately.  Gait; he rises slowly from a  chair.  He is able to ambulate without much difficulty.  Reflexes;  diminished left triceps jerk, otherwise 2+ symmetric.  Toes are  downgoing bilaterally.   LABORATORY REVIEW:  CBC remarkable for a white count of 3.9, hemoglobin  14.15, platelets 177,000.  BMET normal except for a minimally elevated  glucose of 108.  Cardiac enzymes negative.  Coags normal.  Liver  functions negative.  Chest x-ray is normal.   IMPRESSION:  1. Epilepsy with very rare generalized seizures, breakthrough today      most likely due to concomitant tramadol use and possible sleep      deprivation as well.  2. History of intolerance to multiple anticonvulsants.  3. Neck and left arm pain, with exam suggestive of C7 radiculopathy.   PLAN:  MRI of the cervical spine.  Pain control for now.  Expect on the  basis of his MRI findings will be the neurosurgical consult.  For his  epilepsy, we discussed medications, and I advised him rather he be on  medication of some kind to reduce his future risk of seizure.  He did  not want to retry any of the medications he has been on, but was willing  to consider a trial of Lamictal.  I will let him and his wife think  about that and we can start a trial if need be.   Thank you for the consultation.      Michael L. Doy Mince, M.D.  Electronically Signed     MLR/MEDQ  D:  07/23/2008  T:  07/24/2008  Job:  [id number]

## 2010-08-01 NOTE — Consult Note (Signed)
NAMEFERAS, MUSTAPHA            ACCOUNT NO.:  [id number]   MEDICAL RECORD NO.:  [id number]          PATIENT TYPE:  INP   LOCATION:  3729                         FACILITY:  [facility]   PHYSICIAN:  Marchia Meiers. Vertell Limber, M.D.  DATE OF BIRTH:  1960/10/29   DATE OF CONSULTATION:  07/25/2008  DATE OF DISCHARGE:                                 CONSULTATION   REASON FOR CONSULTATION:  Left C7 radiculopathy.   HISTORY OF PRESENT ILLNESS:  Isham Wickman is a 50 year old man with  a history of epilepsy, coronary artery disease, status post coronary  artery bypass grafting with a 38-month history of left arm pain, much  more severe over the last several weeks.  He had a seizure in the  hospital (he was admitted for left arm and chest pain), which was felt  by Dr. Doy Mince from Neurology to be likely secondary to sleep  deprivation and usage of tramadol for pain.  The patient has had an MRI  of his cervical spine after Neurology consultation for left arm  weakness, which shows biforaminal stenosis at C5-6 with a large  herniated disk at C6-7 on the left causing left C7 nerve root  compression along with compression of the left hemicord.  The patient is  miserable with pain and is unable to sit, rest, or sleep.  He was a  smoker, but has stopped.  He had a four-vessel coronary bypass graft by  Dr. Roxan Hockey in January 2009.   PAST MEDICAL HISTORY:  Significant for:  1. Hypertension.  2. Elevated cholesterol.  3. Left Bell palsy.   ALLERGIES:  He notes reactions to various anticonvulsants including  DILANTIN, DEPAKOTE, and KEPPRA.   PHYSICAL EXAMINATION:  VITAL SIGNS:  Temperature is 97.9, blood pressure  of 177/115, heart rate is 96, and respiratory rate is 20.  NEUROLOGIC:  He is awake, alert, and fully oriented.  He is quite  uncomfortable.  He has increased pain in his neck with extension and  positive Spurling maneuver to the left with reproducible left arm pain  with rotation to the  left.  He has then negative Lhermitte sign with  axial compression.  He has left parascapular pain.  Cranial nerve  examination is normal apart from some mild left facial weakness.  Motor  examination is normal, and the upper and lower extremities apart from  decreased left triceps strength is 4-/5 and left wrist flexion at 4-/5.  He has hypersensitivity to pain in the second and third digits on the  left.  Deep tendon reflexes are 2 in the biceps, 2 at the right triceps,  absent at left triceps, 2 at the knee, and 2 at the ankles.  Toes are  downgoing to plantar stimulation.  Cerebellar examination is normal.   IMPRESSION AND RECOMMENDATIONS:  Matty Oftedahl is a 50 year old man,  who is quite miserable secondary to the left C7 radiculopathy.  Because  of left C7 radiculopathy with a herniated cervical disk at C6-7 and  biforaminal stenosis and spondylosis at C5-6.  I will start the patient  on Medrol Dosepak, which should help, give  him some  relief in addition to the pain medications.  He will need  Cardiology clearance for surgery and if cleared, I would recommend he  undergo anterior cervical decompression and fusion at C5-6 and C6-7  levels as soon as possible.  I will follow the patient.      Marchia Meiers. Vertell Limber, M.D.  Electronically Signed     JDS/MEDQ  D:  07/25/2008  T:  07/26/2008  Job:  [id number]

## 2010-08-01 NOTE — Consult Note (Signed)
NAMESHAYDON, ROESCH            ACCOUNT NO.:  [id number]   MEDICAL RECORD NO.:  [id number]          PATIENT TYPE:  INP   LOCATION:  6526                         FACILITY:  [facility]   PHYSICIAN:  Revonda Standard. Roxan Hockey, M.D.DATE OF BIRTH:  Jul 27, 1960   DATE OF CONSULTATION:  04/08/2007  DATE OF DISCHARGE:                                 CONSULTATION   REASON FOR CONSULTATION:  Left main and three-vessel disease.   HISTORY OF PRESENT ILLNESS:  Mr. Kentner is a 50 year old gentleman who  presents with chief complaint of chest pain. He has a history of  multiple cardiac risk factors including type 2 diabetes, hypertension,  tobacco abuse, dyslipidemia, and a strong family history.  He states  over the past 3-4 days he has been having chest pain. This as  substernal, may be slightly biased to the left side, with radiation to  the neck is well as the left arm associated with dyspnea and  diaphoresis.  The pain is exacerbated with exertion and has been  relieved by rest.  He noted severe episode after walking the dog and had  to lie down and rest after that. He states in retrospect he has had some  moderate pains previously but really for the past year has had a lack of  energy and is tired very easily. He came to the emergency room because  the pain again was becoming more frequent and more severe. In the  emergency room, his initial cardiac enzymes had a CK-MB of 6.4 and a  troponin of 0.60.  His BNP was normal.  A second set of enzymes,  however, showed an elevation of his CK-MB to 11.7 with relative index of  9.6 and troponin of 1.76.   He underwent cardiac catheterization today which showed a 70% distal  left main stenosis as well as three-vessel coronary disease.  Left  ventriculogram apparently was done but was not recorded on the films but  reportedly had an ejection fraction of 45%.   The patient currently is painfree.   PAST MEDICAL HISTORY:  Significant for:  1. Epilepsy.  2. Dyslipidemia.  3. Type 2 diabetes, not treated with medication.  4. Hypertension.  5. Tobacco abuse.  6. Marijuana abuse.  7. 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   MEDICATIONS ON ADMISSION:  1. Paxil 20 mg daily.  2. Depakote 500 mg b.i.d.  3. Klonopin 1 mg b.i.d.  4. Albuterol inhaler as needed.   ALLERGIES:  He has allergies to PENICILLIN and DILANTIN.  DILANTIN  causes panic attacks, vertigo, and shortness of breath.   FAMILY HISTORY:  Significant for coronary disease in his father who had  his first difficulties in his late 82s and has had previous CABG, now  alive at age 59.   SOCIAL HISTORY:  He is married. He has three children.  He lives with  his wife.  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 Also  has smoked marijuana for 20 years.   REVIEW OF SYSTEMS:  He has no history of excessive bleeding or bruising,  no history of stroke  or TIA.  He does complain of numbness in his legs,  both feet as well as the upper left leg. He does have back pain. He had  bronchitis about 6 weeks ago.  He has not had any recent seizures.  All  other systems are negative.   PHYSICAL EXAMINATION:  GENERAL:  Mr. Demos is a 50 year old white  male in no acute distress.  VITAL SIGNS:  Blood pressure 133/67, pulse 50 and regular, respirations  16.  Oxygen saturation is 96% on room air.  GENERAL:  He is well developed and well nourished and in no acute  distress.  HEENT:  Exam is unremarkable.  NEUROLOGICAL:  He is alert and oriented x3. His speech is mildly slow  with some slight psychomotor slowness, I suspect due to sedation around  the time of catheterization.  LUNGS:  Clear.  CARDIAC:  Regular rate and rhythm.  Normal S1-S2.  No rubs, murmurs, or  gallops.  NECK:  Supple without thyromegaly, adenopathy or bruits.  ABDOMEN:  Soft.  There is no hepatomegaly.  EXTREMITIES:  Without clubbing, cyanosis or edema.  He has 2+ pulses.  SKIN:  Warm and  dry.   LABORATORY DATA:  His cholesterol is 170, but HDL is low at 20, and LDL  is elevated 120.  CPK 122, CK-MB 11.7, troponin 1.76. Sodium 138,  potassium 3.9, BUN 6, creatinine 0.96. White count 6.8, hematocrit 35,  platelets 259. D-dimer 0.8.  BNP is 99.   EKG shows sinus bradycardia with no acute ischemic changes.   Chest x-ray has not yet been read.   IMPRESSION:  Mr. Milne is a 50 year old gentleman with multiple  cardiac risk factors who presents with crescendo angina.  He has ruled  in for a non-Q-wave myocardial infarction. At catheterization, he had  left main and three-vessel disease with some impairment of his left  ventricular function with ejection fraction of 45%. Coronary artery  bypass grafting is indicated for survival benefit as well as relief of  symptoms.  I have discussed in detail with him and his wife the  indications, risks, benefits and alternative treatments.  They  understand the reasoning for coronary bypass grafting for relief of  symptoms, prevention MI, and survival benefit. They do understand that  this is a palliative treatment and not curative and that lifestyle  modification is important for his long-term outcome but also that other  procedures may be necessary in the future.  They understand the risks of  surgery include but are not limited to death, stroke, MI, DVT, PE,  bleeding, possible need for transfusions, infections as well as other  organ system dysfunction including respiratory, renal or GI  complications.  I did discuss with them the possible use of the  left radial artery.  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  We tentatively plan for surgery Friday  morning, January 23, first available operating date. If a slot on the OR  schedule were to become available before that, we could try to move him  up.      Revonda Standard Roxan Hockey, M.D.  Electronically Signed     SCH/MEDQ  D:   04/08/2007  T:  04/08/2007  Job:  [id number]   cc:   Birdie Riddle, M.D.

## 2010-08-04 NOTE — Discharge Summary (Signed)
NAMEKARAS, MACHNIK            ACCOUNT NO.:  [id number]   MEDICAL RECORD NO.:  [id number]          PATIENT TYPE:  INP   LOCATION:  [id number]                         FACILITY:  [facility]   PHYSICIAN:  Birdie Riddle, M.D.  DATE OF BIRTH:  09-07-1960   DATE OF ADMISSION:  07/23/2008  DATE OF DISCHARGE:  07/29/2008                               DISCHARGE SUMMARY   The patient is admitted by Dr. Charolette Forward.   FINAL DIAGNOSES:  1. Cervical disk displacement.  2. Cervical spondylosis.  3. Cervical disk degeneration.  4. Coronary atherosclerosis of native vessel.  5. Epilepsy.  6. Brachial neuritis.  7. Hypertension.  8. Hypercholesterolemia.  9. Tobacco use disorder.  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   PRINCIPAL PROCEDURE:  Cervical fusion anterior and excision of  interventricular disk by Dr. Erline Levine.   DISCHARGE MEDICATIONS:  1. Vicodin 5/500 one or two p.o. q.4-6 h. p.r.n. for pain.  2. Flexeril 10 mg 1 p.o. q.8 h. as needed.  3. Aspirin 325 mg 1 daily.  4. Metoprolol 25 mg 1 twice daily.  5. Lipitor 40 mg 1 in the evening.  6. Lisinopril 20 mg 1 twice daily.  7. Clonazepam (Klonopin) 1 mg twice daily.  8. Ambien 5 mg at bedtime.   DISCHARGE DIET:  Low-sodium heart-healthy diet.   DISCHARGE ACTIVITY:  1. The patient is to increase activity slowly and may walk up steps,      may shower.  No lifting, pulling, pushing for 5 weeks and no      driving for 2 weeks.  2. Follow up with Dr. Vertell Limber in 1-2 weeks.  The patient is to call [phone] for a followup.   HISTORY:  This is a 50 year old white male with history of coronary  artery disease and bypass graft surgery in January 2009 presented with  left-sided chest pain and left arm pain.   PHYSICAL EXAMINATION:  VITAL SIGNS:  Blood pressure 162/92, pulse 79.  GENERAL:  The patient is alert, oriented x3, in no acute distress.  HEENT:  Conjunctivae pink.  NECK:  Supple.  No JVD.  No carotid bruit.  LUNGS:  Clear  to auscultation.  HEART:  Normal S1 and S2 without S3 gallop.  ABDOMEN:  Soft and nontender.  EXTREMITIES:  No edema, cyanosis, clubbing.   LABORATORY DATA:  Normal CK-MB, troponin I.  Electrolytes were normal.  BUN and creatinine unremarkable.  Glucose borderline at 108.  Cholesterol elevated at 233, triglyceride elevated to 61, and LDL  cholesterol of 152, HDL cholesterol low at 29.   C-spine x-ray revealed anterior cervical diskectomy.   Nuclear medicine stress test showed small focus of reversible ischemia  in basilar segment of inferior wall, probably secondary to CABG for  atypical location of ischemia.   HOSPITAL COURSE:  The patient was admitted to telemetry unit.  He had a  neurosurgical consult of Dr. Erline Levine with his known history of C7  radiculopathy and C5-6 and C6-7 disk disease.  He underwent cervical  fusion and excision of intervertebral disk after nuclear stress test  that showed  small focus of reversible ischemia.  Because of atypical  location of ischemia, the patient was cleared for surgery.  His post  surgical events were unremarkable, and on Jul 29, 2008, he was  discharged home in satisfactory condition with followup by Dr. Erline Levine in 1-2 weeks.      Birdie Riddle, M.D.  Electronically Signed     ASK/MEDQ  D:  09/02/2008  T:  09/02/2008  Job:  [id number]

## 2010-12-08 LAB — PROTIME-INR
INR: 1.3
Prothrombin Time: 12.8
Prothrombin Time: 16.5 — ABNORMAL HIGH

## 2010-12-08 LAB — BASIC METABOLIC PANEL
BUN: 5 — ABNORMAL LOW
CO2: 25
CO2: 30
CO2: 30
Calcium: 7.4 — ABNORMAL LOW
Calcium: 8.7
Calcium: 9.4
Chloride: 101
Creatinine, Ser: 1.04
GFR calc Af Amer: >60
GFR calc Af Amer: >60
GFR calc non Af Amer: >60
Glucose, Bld: 101 — ABNORMAL HIGH
Potassium: 3.8
Sodium: 134 — ABNORMAL LOW
Sodium: 138
Sodium: 140

## 2010-12-08 LAB — I-STAT 8, (EC8 V) (CONVERTED LAB)
BUN: 14
BUN: 7
Bicarbonate: 28.8 — ABNORMAL HIGH
Chloride: 106
Glucose, Bld: 109 — ABNORMAL HIGH
Glucose, Bld: 83
HCT: 36 — ABNORMAL LOW
Hemoglobin: 12.2 — ABNORMAL LOW
Operator id: 294341
Sodium: 138
TCO2: 30
pCO2, Ven: 45.6
pCO2, Ven: 47.6
pH, Ven: 7.328 — ABNORMAL HIGH

## 2010-12-08 LAB — I-STAT EC8
BUN: 7
Bicarbonate: 24.2 — ABNORMAL HIGH
HCT: 27 — ABNORMAL LOW
Operator id: 128731
TCO2: 25
pCO2 arterial: 41.5

## 2010-12-08 LAB — BLOOD GAS, ARTERIAL
FIO2: 0.21
pCO2 arterial: 45.3 — ABNORMAL HIGH
pO2, Arterial: 72.2 — ABNORMAL LOW

## 2010-12-08 LAB — POCT CARDIAC MARKERS
CKMB, poc: 6.4
Troponin i, poc: 0.6

## 2010-12-08 LAB — POCT I-STAT 4, (NA,K, GLUC, HGB,HCT)
Glucose, Bld: 102 — ABNORMAL HIGH
Glucose, Bld: 104 — ABNORMAL HIGH
Glucose, Bld: 111 — ABNORMAL HIGH
HCT: 25 — ABNORMAL LOW
HCT: 34 — ABNORMAL LOW
HCT: 36 — ABNORMAL LOW
Hemoglobin: 11.6 — ABNORMAL LOW
Hemoglobin: 12.2 — ABNORMAL LOW
Hemoglobin: 8.5 — ABNORMAL LOW
Hemoglobin: 9.2 — ABNORMAL LOW
Operator id: 3406
Operator id: 3406
Operator id: 3406
Potassium: 3.7
Potassium: 4.8
Sodium: 132 — ABNORMAL LOW
Sodium: 135
Sodium: 137

## 2010-12-08 LAB — POCT I-STAT 3, ART BLOOD GAS (G3+)
Acid-Base Excess: 1
Bicarbonate: 23.9
Bicarbonate: 26 — ABNORMAL HIGH
O2 Saturation: 96
Operator id: 252761
Operator id: 3406
Patient temperature: 35.4
pCO2 arterial: 24.1 — ABNORMAL LOW
pCO2 arterial: 41.2
pH, Arterial: 7.408
pH, Arterial: 7.556 — ABNORMAL HIGH
pO2, Arterial: 175 — ABNORMAL HIGH
pO2, Arterial: 257 — ABNORMAL HIGH
pO2, Arterial: 70 — ABNORMAL LOW

## 2010-12-08 LAB — CBC
HCT: 28 — ABNORMAL LOW
HCT: 30 — ABNORMAL LOW
HCT: 34.7 — ABNORMAL LOW
Hemoglobin: 10.2 — ABNORMAL LOW
Hemoglobin: 12 — ABNORMAL LOW
Hemoglobin: 12.3 — ABNORMAL LOW
Hemoglobin: 12.6 — ABNORMAL LOW
Hemoglobin: 9.6 — ABNORMAL LOW
MCHC: 34
MCHC: 34
MCHC: 34.3
MCHC: 34.3
MCHC: 34.5
MCHC: 34.8
MCV: 89
MCV: 89
MCV: 89.8
MCV: 90.1
MCV: 90.1
MCV: 90.1
Platelets: 145 — ABNORMAL LOW
Platelets: 164
Platelets: 174
Platelets: 257
Platelets: 264
Platelets: 269
Platelets: 284
RBC: 3.27 — ABNORMAL LOW
RBC: 3.92 — ABNORMAL LOW
RBC: 4.49
RDW: 13.7
RDW: 13.9
RDW: 14
RDW: 14.1
WBC: 11 — ABNORMAL HIGH
WBC: 13 — ABNORMAL HIGH
WBC: 5.7
WBC: 6.4
WBC: 8.2

## 2010-12-08 LAB — HEPARIN LEVEL (UNFRACTIONATED)
Heparin Unfractionated: 0.25 — ABNORMAL LOW
Heparin Unfractionated: 0.46
Heparin Unfractionated: 0.58
Heparin Unfractionated: 0.65

## 2010-12-08 LAB — CARDIAC PANEL(CRET KIN+CKTOT+MB+TROPI)
CK, MB: 11.7 — ABNORMAL HIGH
Relative Index: 6.6 — ABNORMAL HIGH
Relative Index: 9.6 — ABNORMAL HIGH
Total CK: 122
Troponin I: 1.39
Troponin I: 1.76

## 2010-12-08 LAB — B-NATRIURETIC PEPTIDE (CONVERTED LAB): Pro B Natriuretic peptide (BNP): 99

## 2010-12-08 LAB — URINALYSIS, ROUTINE W REFLEX MICROSCOPIC
Ketones, ur: NEGATIVE
Nitrite: NEGATIVE
Urobilinogen, UA: 1
pH: 8

## 2010-12-08 LAB — CK TOTAL AND CKMB (NOT AT ARMC)
CK, MB: 7.9 — ABNORMAL HIGH
Relative Index: 5 — ABNORMAL HIGH

## 2010-12-08 LAB — COMPREHENSIVE METABOLIC PANEL
ALT: 14
AST: 22
Albumin: 3.1 — ABNORMAL LOW
Alkaline Phosphatase: 63
GFR calc Af Amer: >60
Potassium: 3.8
Sodium: 139
Total Protein: 7

## 2010-12-08 LAB — DIFFERENTIAL
Basophils Absolute: 0
Eosinophils Absolute: 0.1
Eosinophils Relative: 2
Monocytes Absolute: 0.4

## 2010-12-08 LAB — D-DIMER, QUANTITATIVE: D-Dimer, Quant: 0.8 — ABNORMAL HIGH

## 2010-12-08 LAB — POCT I-STAT GLUCOSE: Glucose, Bld: 93

## 2010-12-08 LAB — CREATININE, SERUM
Creatinine, Ser: 0.84
GFR calc Af Amer: >60
GFR calc Af Amer: >60
GFR calc non Af Amer: >60

## 2010-12-08 LAB — TYPE AND SCREEN

## 2010-12-08 LAB — ABO/RH: ABO/RH(D): A POS

## 2010-12-08 LAB — POCT I-STAT CREATININE: Operator id: 294341

## 2010-12-08 LAB — HEMOGLOBIN A1C: Hgb A1c MFr Bld: 5.8

## 2010-12-08 LAB — HEMOGLOBIN AND HEMATOCRIT, BLOOD
HCT: 25.6 — ABNORMAL LOW
Hemoglobin: 8.6 — ABNORMAL LOW

## 2010-12-08 LAB — MAGNESIUM: Magnesium: 2.4

## 2010-12-08 LAB — POTASSIUM: Potassium: 4.6

## 2012-02-27 ENCOUNTER — Observation Stay (HOSPITAL_COMMUNITY)
Admit: 2012-02-27 | Discharge: 2012-02-27 | Disposition: A | Discharge status: Home / Self Care | Payer: Medicaid Other | Attending: Internal Medicine | Admitting: Internal Medicine

## 2012-02-27 ENCOUNTER — Observation Stay (HOSPITAL_COMMUNITY): Payer: Medicaid Other

## 2012-02-27 ENCOUNTER — Observation Stay (HOSPITAL_COMMUNITY)
Admission: EM | Admit: 2012-02-27 | Discharge: 2012-02-28 | Disposition: A | Discharge status: Home / Self Care | Payer: Medicaid Other | Attending: Internal Medicine | Admitting: Internal Medicine

## 2012-02-27 ENCOUNTER — Emergency Department (HOSPITAL_COMMUNITY): Payer: Medicaid Other

## 2012-02-27 ENCOUNTER — Encounter (HOSPITAL_COMMUNITY): Payer: Self-pay | Admitting: *Deleted

## 2012-02-27 DIAGNOSIS — I10 Essential (primary) hypertension: Secondary | ICD-10-CM | POA: Diagnosis present

## 2012-02-27 DIAGNOSIS — I498 Other specified cardiac arrhythmias: Secondary | ICD-10-CM | POA: Insufficient documentation

## 2012-02-27 DIAGNOSIS — R9389 Abnormal findings on diagnostic imaging of other specified body structures: Secondary | ICD-10-CM

## 2012-02-27 DIAGNOSIS — E876 Hypokalemia: Secondary | ICD-10-CM | POA: Insufficient documentation

## 2012-02-27 DIAGNOSIS — R569 Unspecified convulsions: Secondary | ICD-10-CM | POA: Diagnosis present

## 2012-02-27 DIAGNOSIS — G40309 Generalized idiopathic epilepsy and epileptic syndromes, not intractable, without status epilepticus: Secondary | ICD-10-CM

## 2012-02-27 DIAGNOSIS — I7300 Raynaud's syndrome without gangrene: Secondary | ICD-10-CM | POA: Diagnosis present

## 2012-02-27 DIAGNOSIS — Z79899 Other long term (current) drug therapy: Secondary | ICD-10-CM | POA: Insufficient documentation

## 2012-02-27 DIAGNOSIS — R946 Abnormal results of thyroid function studies: Secondary | ICD-10-CM | POA: Insufficient documentation

## 2012-02-27 DIAGNOSIS — I776 Arteritis, unspecified: Secondary | ICD-10-CM

## 2012-02-27 DIAGNOSIS — G40909 Epilepsy, unspecified, not intractable, without status epilepticus: Principal | ICD-10-CM | POA: Insufficient documentation

## 2012-02-27 HISTORY — DX: Essential (primary) hypertension: I10

## 2012-02-27 HISTORY — DX: Anxiety disorder, unspecified: F41.9

## 2012-02-27 HISTORY — DX: Unspecified convulsions: R56.9

## 2012-02-27 HISTORY — DX: Insomnia, unspecified: G47.00

## 2012-02-27 LAB — RAPID URINE DRUG SCREEN, HOSP PERFORMED
Amphetamines: NOT DETECTED
Benzodiazepines: NOT DETECTED
Cocaine: NOT DETECTED
Opiates: NOT DETECTED

## 2012-02-27 LAB — COMPREHENSIVE METABOLIC PANEL
Albumin: 3.4 g/dL — ABNORMAL LOW (ref 3.5–5.2)
Alkaline Phosphatase: 62 U/L (ref 39–117)
BUN: 13 mg/dL (ref 6–23)
Calcium: 9 mg/dL (ref 8.4–10.5)
Potassium: 3.5 mEq/L (ref 3.5–5.1)
Total Protein: 7.6 g/dL (ref 6.0–8.3)

## 2012-02-27 LAB — CBC
Hemoglobin: 13.9 g/dL (ref 13.0–17.0)
MCH: 30.4 pg (ref 26.0–34.0)
MCH: 30.5 pg (ref 26.0–34.0)
MCHC: 34.8 g/dL (ref 30.0–36.0)
MCHC: 35.5 g/dL (ref 30.0–36.0)
Platelets: 192 10*3/uL (ref 150–400)
Platelets: 192 10*3/uL (ref 150–400)

## 2012-02-27 LAB — URINALYSIS, ROUTINE W REFLEX MICROSCOPIC
Glucose, UA: NEGATIVE mg/dL
Ketones, ur: NEGATIVE mg/dL
Leukocytes, UA: NEGATIVE
Specific Gravity, Urine: 1.013 (ref 1.005–1.030)
pH: 6.5 (ref 5.0–8.0)

## 2012-02-27 LAB — POCT I-STAT, CHEM 8
Creatinine, Ser: 1 mg/dL (ref 0.50–1.35)
Hemoglobin: 14.3 g/dL (ref 13.0–17.0)
Sodium: 141 mEq/L (ref 135–145)
TCO2: 26 mmol/L (ref 0–100)

## 2012-02-27 LAB — PROTIME-INR
INR: 0.95 (ref 0.00–1.49)
Prothrombin Time: 12.6 seconds (ref 11.6–15.2)

## 2012-02-27 LAB — CREATININE, SERUM: Creatinine, Ser: 0.78 mg/dL (ref 0.50–1.35)

## 2012-02-27 LAB — C-REACTIVE PROTEIN: CRP: <0.5 mg/dL — ABNORMAL LOW (ref <0.60)

## 2012-02-27 MED ORDER — HYDRALAZINE HCL 20 MG/ML IJ SOLN
5.0000 mg | INTRAMUSCULAR | Status: DC | PRN
  Administered 2012-02-28: 10 mg via INTRAVENOUS
  Filled 2012-02-27: qty 0.5
  Filled 2012-02-27: qty 1

## 2012-02-27 MED ORDER — SENNOSIDES-DOCUSATE SODIUM 8.6-50 MG PO TABS
1.0000 | ORAL_TABLET | Freq: Every day | ORAL | Status: DC | PRN
  Filled 2012-02-27: qty 1

## 2012-02-27 MED ORDER — CLONIDINE HCL 0.1 MG PO TABS: 0.2000 mg | ORAL_TABLET | Freq: Two times a day (BID) | ORAL | Status: DC

## 2012-02-27 MED ORDER — CALCIUM POLYCARBOPHIL 625 MG PO TABS
625.0000 mg | ORAL_TABLET | Freq: Every day | ORAL | Status: DC | PRN
  Filled 2012-02-27: qty 1

## 2012-02-27 MED ORDER — LORAZEPAM 1 MG PO TABS
ORAL_TABLET | ORAL | Status: AC
  Filled 2012-02-27: qty 1

## 2012-02-27 MED ORDER — HEPARIN SODIUM (PORCINE) 5000 UNIT/ML IJ SOLN
5000.0000 [IU] | Freq: Three times a day (TID) | INTRAMUSCULAR | Status: DC
  Administered 2012-02-27 – 2012-02-28 (×5): 5000 [IU] via SUBCUTANEOUS
  Filled 2012-02-27 (×9): qty 1

## 2012-02-27 MED ORDER — LORAZEPAM 1 MG PO TABS
1.0000 mg | ORAL_TABLET | Freq: Once | ORAL | Status: AC
Start: 2012-02-27 — End: 2012-02-27
  Administered 2012-02-27: 1 mg via ORAL

## 2012-02-27 MED ORDER — METOPROLOL SUCCINATE ER 50 MG PO TB24
50.0000 mg | ORAL_TABLET | Freq: Every day | ORAL | Status: DC
  Filled 2012-02-27: qty 1

## 2012-02-27 MED ORDER — ACETAMINOPHEN 325 MG PO TABS
650.0000 mg | ORAL_TABLET | Freq: Four times a day (QID) | ORAL | Status: DC | PRN
  Administered 2012-02-27 – 2012-02-28 (×3): 650 mg via ORAL
  Filled 2012-02-27 (×2): qty 2

## 2012-02-27 MED ORDER — SODIUM CHLORIDE 0.9 % IJ SOLN
3.0000 mL | Freq: Two times a day (BID) | INTRAMUSCULAR | Status: DC
  Administered 2012-02-27 (×2): 3 mL via INTRAVENOUS

## 2012-02-27 MED ORDER — ACETAMINOPHEN 325 MG PO TABS
ORAL_TABLET | ORAL | Status: AC
  Administered 2012-02-27: 650 mg via ORAL
  Filled 2012-02-27: qty 2

## 2012-02-27 MED ORDER — METOPROLOL SUCCINATE ER 25 MG PO TB24
25.0000 mg | ORAL_TABLET | Freq: Every day | ORAL | Status: DC
  Administered 2012-02-27: 25 mg via ORAL
  Filled 2012-02-27 (×3): qty 1

## 2012-02-27 MED ORDER — CLONAZEPAM 1 MG PO TABS
1.0000 mg | ORAL_TABLET | Freq: Two times a day (BID) | ORAL | Status: DC | PRN
  Administered 2012-02-28: 1 mg via ORAL
  Filled 2012-02-27: qty 1

## 2012-02-27 MED ORDER — PAROXETINE MESYLATE 40 MG PO TABS: 40.0000 mg | ORAL_TABLET | ORAL | Status: DC

## 2012-02-27 MED ORDER — LEVETIRACETAM 500 MG PO TABS
500.0000 mg | ORAL_TABLET | Freq: Two times a day (BID) | ORAL | Status: DC
  Administered 2012-02-27 – 2012-02-28 (×2): 500 mg via ORAL
  Filled 2012-02-27 (×3): qty 1

## 2012-02-27 MED ORDER — ZOLPIDEM TARTRATE 10 MG PO TABS
10.0000 mg | ORAL_TABLET | Freq: Every day | ORAL | Status: DC
  Administered 2012-02-27: 10 mg via ORAL
  Filled 2012-02-27: qty 1

## 2012-02-27 MED ORDER — LISINOPRIL 40 MG PO TABS
40.0000 mg | ORAL_TABLET | Freq: Every day | ORAL | Status: DC
  Administered 2012-02-27 – 2012-02-28 (×2): 40 mg via ORAL
  Filled 2012-02-27 (×2): qty 1

## 2012-02-27 MED ORDER — ASPIRIN 325 MG PO TABS
325.0000 mg | ORAL_TABLET | Freq: Every day | ORAL | Status: DC
  Administered 2012-02-27 – 2012-02-28 (×2): 325 mg via ORAL
  Filled 2012-02-27 (×2): qty 1

## 2012-02-27 MED ORDER — AMLODIPINE BESYLATE 5 MG PO TABS
5.0000 mg | ORAL_TABLET | Freq: Every day | ORAL | Status: DC
  Administered 2012-02-27: 5 mg via ORAL
  Filled 2012-02-27 (×2): qty 1

## 2012-02-27 MED ORDER — PAROXETINE HCL 20 MG PO TABS
40.0000 mg | ORAL_TABLET | Freq: Every day | ORAL | Status: DC
  Administered 2012-02-27 – 2012-02-28 (×2): 40 mg via ORAL
  Filled 2012-02-27 (×2): qty 2

## 2012-02-27 NOTE — ED Notes (Signed)
Pt had seizure episode x2 back to back tonight, lasting about 45 seconds each. On ems arrival, pt in post ictal state. Around after 5 minutes, pt more alert and following some commands. Hx of the same.

## 2012-02-27 NOTE — Progress Notes (Signed)
EEG COMPLETED

## 2012-02-27 NOTE — ED Provider Notes (Signed)
History     CSN: [id number]  Arrival date & time 02/27/12  0005   First MD Initiated Contact with Patient 02/27/12 0135      Chief Complaint  Patient presents with  . Seizures    (Consider location/radiation/quality/duration/timing/severity/associated sxs/prior treatment) HPI 51 year old male presents to emergency room at [facility] after having a seizure. Patient has history of epilepsy since he was a child. He has had intermittent seizures over the last several years. His last seizure was 2010, and the time before that 2007. Tonight patient had aura where he felt a seizure coming on, remembers his hands curling up, and then had generalized tonic-clonic seizure per bystanders. Patient had second seizure without recovery from the first. Patient reports he has been on multiple different antiepileptics in the past but has had bad side effects from them. He is not currently on any antiepileptic. Patient reports over last several months he has had occasions where he he feels an aura of a seizure coming on, felt dizzy and "off" but has not had a seizure. He reports he normally takes a Klonopin when he feels this way, and thinks that he is aboarting the seizures in this fashion. He had an appointment to followup with neurology, but was denied due to not having insurance. He is followed at [facility]. Patient has history of coronary artery disease, hypertension and prior neck surgeries. Patient had hypertension in 2010 associated with neck pain. He was given nitroglycerin and had prolonged tonic-clonic seizure soon after receiving the nitroglycerin. Patient is frustrated, having no insurance that he cannot see specialists to get him under control. Patient was on Dilantin as a teenager, the switch switched to Depakote which caused depression and suicidal thoughts. He then tried Keppra briefly but this also caused depression. He was suggested Lamictal by the neurology note in 2010, but family  does not room at the outcome of this. Tonight patient noted be significantly hypertensive. He reports his systolic normally is in the 160s. He has not missed any medications, but has not taken his evening doses. He did not strike his head tonight. He denies any chest pain or shortness of breath. Patient has generalized fatigue and weakness, wife reports that he has early wakening usually around 4 or 5:00 but then sleeps most of the day. He is on an antidepressant. Patient also complaining of cold feet with a burning stabbing sensation. Wife reports when he gets cold his toes turn blue and have small red dots that appear and last for weeks. Patient is a smoker, smokes one pack a day. He had been using marijuana to help with seizures, but has been drug free over the last 2 months.  Past Medical History  Diagnosis Date  . Seizure   . Anxiety   . Insomnia   . Hypertension     Past Surgical History  Procedure Date  . Coronary artery bypass graft   . Neck surgery     No family history on file.  History  Substance Use Topics  . Smoking status: Current Every Day Smoker  . Smokeless tobacco: Not on file  . Alcohol Use: No      Review of Systems  See History of Present Illness; otherwise all other systems are reviewed and negative Allergies  Review of patient's allergies indicates no known allergies.  Home Medications   Current Outpatient Rx  Name  Route  Sig  Dispense  Refill  . ASPIRIN 325 MG PO TABS   Oral  Take 325 mg by mouth daily.         Marland Kitchen CLONAZEPAM 1 MG PO TABS   Oral   Take 1 mg by mouth 2 (two) times daily as needed. For anxiety         . IRON PO   Oral   Take by mouth.         Marland Kitchen LISINOPRIL 40 MG PO TABS   Oral   Take 40 mg by mouth daily.         Marland Kitchen METOPROLOL SUCCINATE ER 50 MG PO TB24   Oral   Take 50 mg by mouth at bedtime. Take with or immediately following a meal.         . PAROXETINE MESYLATE 40 MG PO TABS   Oral   Take 40 mg by mouth every  morning.         Marland Kitchen CALCIUM POLYCARBOPHIL 625 MG PO TABS   Oral   Take 625 mg by mouth daily as needed. For fiber intake         . SENNOSIDES-DOCUSATE SODIUM 8.6-50 MG PO TABS   Oral   Take 1 tablet by mouth daily as needed. For constipation         . ZOLPIDEM TARTRATE 10 MG PO TABS   Oral   Take 10 mg by mouth at bedtime. scheduled           BP 198/92  Pulse 51  Temp 98.4 F (36.9 C) (Oral)  Resp 20  SpO2 95%  Physical Exam  Nursing note and vitals reviewed. Constitutional: He is oriented to person, place, and time. He appears well-developed and well-nourished.  HENT:  Head: Normocephalic and atraumatic.  Right Ear: External ear normal.  Left Ear: External ear normal.  Nose: Nose normal.  Mouth/Throat: Oropharynx is clear and moist.  Eyes: Conjunctivae normal and EOM are normal. Pupils are equal, round, and reactive to light.  Neck: Normal range of motion. Neck supple. No JVD present. No tracheal deviation present. No thyromegaly present.  Cardiovascular: Normal rate, regular rhythm, normal heart sounds and intact distal pulses.  Exam reveals no gallop and no friction rub.   No murmur heard. Pulmonary/Chest: Effort normal and breath sounds normal. No stridor. No respiratory distress. He has no wheezes. He has no rales. He exhibits no tenderness.  Abdominal: Soft. Bowel sounds are normal. He exhibits no distension and no mass. There is no tenderness. There is no rebound and no guarding.  Musculoskeletal: Normal range of motion. He exhibits no edema and no tenderness.  Lymphadenopathy:    He has no cervical adenopathy.  Neurological: He is alert and oriented to person, place, and time. He has normal reflexes. No cranial nerve deficit. He exhibits normal muscle tone. Coordination normal.       Left-sided facial droop, long-standing due to Bell's palsy  Skin: Skin is dry. No rash noted. No erythema. No pallor.       Toes are noted to be significantly colder than the  rest of the foot. He has some blue discoloration to the pads of his toes. He has small red petechial lesions at the bases of his toes which do blanch  Psychiatric: His behavior is normal. Judgment and thought content normal.       Flat affect, depressed mood    ED Course  Procedures (including critical care time)  Labs Reviewed  POCT I-STAT, CHEM 8 - Abnormal; Notable for the following:    Potassium 3.4 (*)  Glucose, Bld 100 (*)     Calcium, Ion 1.09 (*)     All other components within normal limits  COMPREHENSIVE METABOLIC PANEL - Abnormal; Notable for the following:    Glucose, Bld 107 (*)     Albumin 3.4 (*)     All other components within normal limits  CBC  URINE RAPID DRUG SCREEN (HOSP PERFORMED)  TROPONIN I  PROTIME-INR  URINALYSIS, ROUTINE W REFLEX MICROSCOPIC  SEDIMENTATION RATE  TSH   Ct Head Wo Contrast  02/27/2012  *RADIOLOGY REPORT*  Clinical Data: Seizures and confusion.  CT HEAD WITHOUT CONTRAST  Technique:  Contiguous axial images were obtained from the base of the skull through the vertex without contrast.  Comparison: None.  Findings: Patchy low attenuation changes in the basal ganglia bilaterally but more prominent on the right.  This suggest old lacunar infarcts.  Acute lacunar infarct is not definitively excluded.  No mass effect or midline shift.  No abnormal extra- axial fluid collections.  Gray-white matter junctions are distinct. Basal cisterns are not effaced.  No acute intracranial hemorrhage. No ventricular dilatation.  No depressed skull fractures. Visualized paranasal sinuses and mastoid air cells are not opacified.  IMPRESSION: Low attenuation changes in the basal ganglia, greatest on the right, likely to represent old lacunar infarcts of acute infarction not entirely excluded.  No acute intracranial hemorrhage or mass effect.   Original Report Authenticated By: Lucienne Capers, M.D.     Date: 02/27/2012  Rate: 56  Rhythm: sinus bradycardia  QRS  Axis: normal  Intervals: normal  ST/T Wave abnormalities: nonspecific T wave changes  Conduction Disutrbances:none  Narrative Interpretation:   Old EKG Reviewed: unchanged    1. Seizure   2. Hypertension   3. Vasculitis       MDM  51 year old male with seizure tonight not on antiepileptic who is a difficult to control epileptic although infrequent. Patient also with significant hypertension tonight, and ongoing pain and lesions in his toes. Patient has no good neuro followup, concerned given his constellation of symptoms. Feel he would be best admitted and followed closely. I suspect that his peripheral vascular issue is due to Buerger's disease secondary to his smoking, but he may have an underlying vasculitis that is causing him to have seizures, petechial hemorrhages. Will discuss with hospitalist.       Kalman Drape, MD 02/27/12 [phone]

## 2012-02-27 NOTE — Progress Notes (Signed)
Patient:  Frank Carey, Frank Carey   Account Number:  [id number]  Date Initiated:  02/27/2012  Documentation initiated by:  Jackelyn Poling  Subjective/Objective Assessment:   51 yr old male self pay pt with no ed visits but 1 admission in last 6 months pcp is samuel kelly at [facility]     Subjective/Objective Assessment Detail:     Action/Plan:   spoke to pt about pcp pt sleeping Male at bedside confirmed pcp is Scarlette Ar at [facility]   Action/Plan Detail:   Anticipated DC Date:  02/28/2012     Status Recommendation to Physician:   Result of Recommendation:    Other ED Victor  Other  PCP issues    Choice offered to / List presented to:            Status of service:  Completed, signed off  ED Comments:   ED Comments Detail:

## 2012-02-27 NOTE — Progress Notes (Signed)
Correction pcp is at [facility] [place]

## 2012-02-27 NOTE — Progress Notes (Signed)
[facility] PROGRESS NOTE  Frank Carey [id number] DOB: August 28, 1960 DOA: 02/27/2012 PCP: No primary provider on file.  Assessment/Plan: Seizure Has a longstanding known seizure disorder. Patient reports being on seizure medications in the past but due to side effects has not been taking them. Will get an MRI of the brain and EEG. Request neurology consultation. Last episode of seizure was in 2010. Instructed the patient that he cannot drive for at least 6 months since 02/27/2012. Given the history of symptoms when raising his arms above his head will also order ultrasound to evaluate for subclavian steal syndrome. Seizure precautions.  HTN Not well controlled. Continue lisinopril, decrease dose of metoprolol due to bradycardia. Start amlodipine as BP is not well controlled. Continue PRN hydralazine for very high BP in ED.  Bradycardia Decrease dose of metoprolol.   Raynaud's phenomenon Vs vasculitis ESR elevated at 35. CRP and cold agglutinin pending. Continue to monitor.   Code Status: Full Code Family Communication: Patient updated at bedside. No family at bedside.  Disposition Plan: Pending.  Consultants:  Neurology Dr. Nicole Kindred  Procedures:  None  Antibiotics:  None.   HPI/Subjective: No specific concerns. Denies any pain.  Objective: Filed Vitals:   02/27/12 0321 02/27/12 0639 02/27/12 0807 02/27/12 0809  BP: 198/92 190/95 176/91 184/104  Pulse: 51  53 55  Temp: 98.4 F (36.9 C)     TempSrc: Oral     Resp: 20  11 13   SpO2: 95%  98% 99%    Intake/Output Summary (Last 24 hours) at 02/27/12 0817 Last data filed at 02/27/12 0535  Gross per 24 hour  Intake      0 ml  Output    800 ml  Net   -800 ml   There were no vitals filed for this visit.  Exam: Physical Exam: General: Awake, Oriented, No acute distress. HEENT: EOMI. Neck: Supple CV: S1 and S2 Lungs: Clear to ascultation bilaterally Abdomen: Soft, Nontender, Nondistended, +bowel  sounds. Ext: Good pulses. Trace edema.  Data Reviewed: Basic Metabolic Panel:  Lab [id number] 02/27/12 0253 02/27/12 0033  NA -- 138 141  K -- 3.5 3.4*  CL -- 101 103  CO2 -- 29 --  GLUCOSE -- 107* 100*  BUN -- 13 13  CREATININE 0.78 0.87 1.00  CALCIUM -- 9.0 --  MG -- -- --  PHOS -- -- --   Liver Function Tests:  Lab 02/27/12 0253  AST 18  ALT 13  ALKPHOS 62  BILITOT 0.4  PROT 7.6  ALBUMIN 3.4*   No results found for this basename: LIPASE:5,AMYLASE:5 in the last 168 hours No results found for this basename: AMMONIA:5 in the last 168 hours CBC:  Lab 02/27/12 0642 02/27/12 0033 02/27/12 0025  WBC 7.3 -- 6.0  NEUTROABS -- -- --  HGB 13.4 14.3 13.9  HCT 38.5* 42.0 39.1  MCV 87.3 -- 85.9  PLT 192 -- 192   Cardiac Enzymes:  Lab 02/27/12 0253  CKTOTAL --  CKMB --  CKMBINDEX --  TROPONINI <0.30   BNP (last 3 results) No results found for this basename: PROBNP:3 in the last 8760 hours CBG: No results found for this basename: GLUCAP:5 in the last 168 hours  No results found for this or any previous visit (from the past 240 hour(s)).   Studies: Ct Head Wo Contrast  02/27/2012  *RADIOLOGY REPORT*  Clinical Data: Seizures and confusion.  CT HEAD WITHOUT CONTRAST  Technique:  Contiguous axial images were obtained from the base of  the skull through the vertex without contrast.  Comparison: None.  Findings: Patchy low attenuation changes in the basal ganglia bilaterally but more prominent on the right.  This suggest old lacunar infarcts.  Acute lacunar infarct is not definitively excluded.  No mass effect or midline shift.  No abnormal extra- axial fluid collections.  Gray-white matter junctions are distinct. Basal cisterns are not effaced.  No acute intracranial hemorrhage. No ventricular dilatation.  No depressed skull fractures. Visualized paranasal sinuses and mastoid air cells are not opacified.  IMPRESSION: Low attenuation changes in the basal ganglia, greatest on  the right, likely to represent old lacunar infarcts of acute infarction not entirely excluded.  No acute intracranial hemorrhage or mass effect.   Original Report Authenticated By: Lucienne Capers, M.D.     Scheduled Meds:   . aspirin  325 mg Oral Daily  . heparin  5,000 Units Subcutaneous Q8H  . lisinopril  40 mg Oral Daily  . metoprolol succinate  50 mg Oral QHS  . PARoxetine  40 mg Oral Daily  . sodium chloride  3 mL Intravenous Q12H  . zolpidem  10 mg Oral QHS  . [DISCONTINUED] paroxetine mesylate  40 mg Oral [id number]   Continuous Infusions:   Active Problems:  Seizure  HTN (hypertension)  Raynaud phenomenon    Time spent: [facility] Pager [phone]. If 8PM-8AM, please contact night-coverage at [email or web address], password [facility] 02/27/2012, 8:17 AM  LOS: 0 days

## 2012-02-27 NOTE — H&P (Signed)
[facility] History and Physical  Jarik Borromeo [id number] DOB: 1960/11/14 DOA: 02/27/2012  Referring physician: ED PCP: No primary provider on file.  Specialists: None  Chief Complaint: Seizure  HPI: Frank Carey is a 51 y.o. male who presents to the ED after a tonic-clonic seizure onset earlier this evening.  This occurs in the context of a known and chronic seizure disorder dating back for over 40 years.  His last seizure prior to this was in 2010 and before that in 2007, he typically gets 1 seizure every 3 to 4 years not on meds.  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  He does note that this has been happening recently when he raises his arms above his head during showering in the morning.  He is not on any chronic medications to prevent seizures as the side effects have outweighed the benefits in the past.  In the ED, the patient was noted to be very hypertensive, the patient was also noted to have raynaud's phenomenon of his toes on BLE, that reportedly occur when ever he gets cold.  He smokes 1 PPD, uses marijuana to help with seizures but has been drug free over last 2 months.  Hospitalist has been asked to admit.  Review of Systems: 12 systems reviewed and negative.  Past Medical History  Diagnosis Date  . Seizure   . Anxiety   . Insomnia   . Hypertension    Past Surgical History  Procedure Date  . Coronary artery bypass graft   . Neck surgery    Social History:  reports that he has been smoking.  He does not have any smokeless tobacco history on file. He reports that he does not drink alcohol or use illicit drugs.   No Known Allergies  No family history on file.  Prior to Admission medications   Medication Sig Start Date End Date Taking? Authorizing Provider  aspirin 325 MG tablet Take 325 mg by mouth daily.   Yes Historical  Provider, MD  clonazePAM (KLONOPIN) 1 MG tablet Take 1 mg by mouth 2 (two) times daily as needed. For anxiety   Yes Historical Provider, MD  IRON PO Take by mouth.   Yes Historical Provider, MD  lisinopril (PRINIVIL,ZESTRIL) 40 MG tablet Take 40 mg by mouth daily.   Yes Historical Provider, MD  metoprolol succinate (TOPROL-XL) 50 MG 24 hr tablet Take 50 mg by mouth at bedtime. Take with or immediately following a meal.   Yes Historical Provider, MD  paroxetine mesylate (PEXEVA) 40 MG tablet Take 40 mg by mouth every morning.   Yes Historical Provider, MD  polycarbophil (FIBERCON) 625 MG tablet Take 625 mg by mouth daily as needed. For fiber intake   Yes Historical Provider, MD  senna-docusate (SENOKOT-S) 8.6-50 MG per tablet Take 1 tablet by mouth daily as needed. For constipation   Yes Historical Provider, MD  zolpidem (AMBIEN) 10 MG tablet Take 10 mg by mouth at bedtime. scheduled   Yes Historical Provider, MD   Physical Exam: Filed Vitals:   02/27/12 0016 02/27/12 0026 02/27/12 0321  BP: 211/109 194/91 198/92  Pulse: 56  51  Temp: 97.9 F (36.6 C)  98.4 F (36.9 C)  TempSrc:   Oral  Resp: 22  20  SpO2: 90% 95% 95%    General:  NAD, resting comfortably in bed Eyes: PEERLA EOMI  ENT: mucous membranes moist Neck: supple w/o JVD Cardiovascular: RRR w/o MRG Respiratory: CTA B Abdomen: soft, nt, nd, bs+ Skin: toes colder than rest of foot, blue dyscoloration especially to pads of toes, small red blanching petechial lesions at bases of his toes. 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  Labs on Admission:  Basic Metabolic Panel:  Lab [id number] 02/27/12 0033  NA 138 141  K 3.5 3.4*  CL 101 103  CO2 29 --  GLUCOSE 107* 100*  BUN 13 13  CREATININE 0.87 1.00  CALCIUM 9.0 --  MG -- --  PHOS -- --   Liver Function Tests:  Lab 02/27/12 0253  AST  18  ALT 13  ALKPHOS 62  BILITOT 0.4  PROT 7.6  ALBUMIN 3.4*   No results found for this basename: LIPASE:5,AMYLASE:5 in the last 168 hours No results found for this basename: AMMONIA:5 in the last 168 hours CBC:  Lab 02/27/12 0033 02/27/12 0025  WBC -- 6.0  NEUTROABS -- --  HGB 14.3 13.9  HCT 42.0 39.1  MCV -- 85.9  PLT -- 192   Cardiac Enzymes:  Lab 02/27/12 0253  CKTOTAL --  CKMB --  CKMBINDEX --  TROPONINI <0.30    BNP (last 3 results) No results found for this basename: PROBNP:3 in the last 8760 hours CBG: No results found for this basename: GLUCAP:5 in the last 168 hours  Radiological Exams on Admission: Ct Head Wo Contrast  02/27/2012  *RADIOLOGY REPORT*  Clinical Data: Seizures and confusion.  CT HEAD WITHOUT CONTRAST  Technique:  Contiguous axial images were obtained from the base of the skull through the vertex without contrast.  Comparison: None.  Findings: Patchy low attenuation changes in the basal ganglia bilaterally but more prominent on the right.  This suggest old lacunar infarcts.  Acute lacunar infarct is not definitively excluded.  No mass effect or midline shift.  No abnormal extra- axial fluid collections.  Gray-white matter junctions are distinct. Basal cisterns are not effaced.  No acute intracranial hemorrhage. No ventricular dilatation.  No depressed skull fractures. Visualized paranasal sinuses and mastoid air cells are not opacified.  IMPRESSION: Low attenuation changes in the basal ganglia, greatest on the right, likely to represent old lacunar infarcts of acute infarction not entirely excluded.  No acute intracranial hemorrhage or mass effect.   Original Report Authenticated By: Lucienne Capers, M.D.     EKG: Independently reviewed.  Assessment/Plan Active Problems:  Seizure  HTN (hypertension)  Raynaud phenomenon   1. Seizure - with a longstanding known seizure disorder, patient says he will think about wether he wants to be put on a long  term anti seizure medication, he has refused these in the past most recently being in 2010, the side effects seem to outweigh the benefits in his case.  Given his pro-dromal aura which gives him warning ahead of time to get to a safe position, his very infrequent seizures (1 every 3 years for the past 9 years or so), the fact that his seizures have never been associated with status epilepticus, it seems a reasonable approach to hold off on starting anything emergently.  The patient is well aware of driving laws and that he cannot drive after a seizure.  Given the history of symptoms when raising his arms above his head will also order ultrasound to evaluate for subclavian steal syndrome. 2.  HTN - continue home meds, adding hydralazine for very high BP in ED. 3. Raynaud's phenomenon  Vs vasculitis - ordering cold agglutinin, ESR, and CRP to start the work up, Buerger's disease has also been raised by the ED as a possibility though it seems biopsy would be needed to make this diagnosis in this 51 yo patient, also I would have expected this to be more progressive and severe (with that said due to the rarity of the condition, my personal experience with Brueger's disease is admittedly limited to a single case that I observed in medical school).  I doubt the patient has CNS vasculitis given the long duration of his seizure disorder (decades).  Probably will need to involve rheumatology to sort out the differential and work up regarding the Reynaud's as it is quite broad.  No consults called  Code Status: Full Code (must indicate code status--if unknown or must be presumed, indicate so) Family Communication: Spoke with wife at bedside (indicate person spoken with, if applicable, with phone number if by telephone) Disposition Plan: Admit to inpatient (indicate anticipated LOS)  Time spent: 70 min  Alyshia Kernan M. [facility] Pager [phone]  If 7PM-7AM, please contact  night-coverage [email or web address] Password [email or web address] 02/27/2012, 6:07 AM

## 2012-02-27 NOTE — ED Notes (Signed)
Patient returned from CT

## 2012-02-27 NOTE — Progress Notes (Signed)
EPIC updated for pcp CM assisted male at bedside to receive a call from pt's son pt remains asleep Son called to [facility] for pt

## 2012-02-27 NOTE — Progress Notes (Signed)
*  PRELIMINARY RESULTS* Vascular Ultrasound Upper extremity arterial duplex has been completed.   Preliminary findings: Bilaterally normal arterial flow in the vertebral, subclavian, and brachial arteries at rest and with arms lifted above head. Bilateral symmetrical BP's. Not suggestive of subclavian steal. Landry Mellow, RDMS, RVT 02/27/2012, 11:16 AM

## 2012-02-27 NOTE — Consult Note (Signed)
NEURO HOSPITALIST CONSULT NOTE    Reason for Consult: SZ  HPI:                                                                                                                                          Frank Carey is an 51 y.o. male who has known diagnosis of seizure but has not been on AED due to cost. He has been on Dilantin and Depakote in past. Dilantin caused vertigo and unsteady gait, Depakote caused flat affect. His last seizures were 2007 and 2010 both of which he was not on AED. Over the past month he has been having auras which he describes as bilateral arm tremor and a sensation of dizziness. Patient was admitted to hospital after having a seizure--wife in room did not visualize seizure and patient can not recall events. At present time he is back to his baseline.   Past Medical History  Diagnosis Date  . Seizure   . Anxiety   . Insomnia   . Hypertension     Past Surgical History  Procedure Date  . Neck surgery   . Cardiac catheterization 04/17/2007  . Coronary artery bypass graft 04/11/2007    X 4 VESSELS      Family History: Mother-DM, HTN Father--MI, DM, HTN  Social History:  reports that he has been smoking Cigarettes.  He has a 25 pack-year smoking history. He has never used smokeless tobacco. He reports that he does not drink alcohol or use illicit drugs.  No Known Allergies  MEDICATIONS:                                                                                                                     Current Facility-Administered Medications  Medication Dose Route Frequency Provider Last Rate Last Dose  . amLODipine (NORVASC) tablet 5 mg  5 mg Oral Daily Bynum Bellows, MD   5 mg at 02/27/12 1050  . aspirin tablet 325 mg  325 mg Oral Daily Etta Quill, DO   325 mg at 02/27/12 1047  . clonazePAM (KLONOPIN) tablet 1 mg  1 mg Oral BID PRN Etta Quill, DO      . heparin injection 5,000 Units  5,000 Units Subcutaneous Q8H Etta Quill, DO  5,000 Units at 02/27/12 0808  . hydrALAZINE (APRESOLINE) injection 5-10 mg  5-10 mg Intravenous Q4H PRN Etta Quill, DO      . lisinopril (PRINIVIL,ZESTRIL) tablet 40 mg  40 mg Oral Daily Etta Quill, DO   40 mg at 02/27/12 1050  . LORazepam (ATIVAN) 1 MG tablet           . [COMPLETED] LORazepam (ATIVAN) tablet 1 mg  1 mg Oral Once Bynum Bellows, MD   1 mg at 02/27/12 0932  . metoprolol succinate (TOPROL-XL) 24 hr tablet 25 mg  25 mg Oral QHS Bynum Bellows, MD      . PARoxetine (PAXIL) tablet 40 mg  40 mg Oral Daily Kalman Drape, MD   40 mg at 02/27/12 1047  . polycarbophil (FIBERCON) tablet 625 mg  625 mg Oral Daily PRN Etta Quill, DO      . senna-docusate (Senokot-S) tablet 1 tablet  1 tablet Oral Daily PRN Etta Quill, DO      . sodium chloride 0.9 % injection 3 mL  3 mL Intravenous Q12H Etta Quill, DO   3 mL at 02/27/12 1050  . zolpidem (AMBIEN) tablet 10 mg  10 mg Oral QHS Etta Quill, DO      . [DISCONTINUED] cloNIDine (CATAPRES) tablet 0.2 mg  0.2 mg Oral BID Bynum Bellows, MD      . [DISCONTINUED] metoprolol succinate (TOPROL-XL) 24 hr tablet 50 mg  50 mg Oral QHS Etta Quill, DO      . [DISCONTINUED] paroxetine mesylate (PEXEVA) tablet 40 mg  40 mg Oral BH-[id number] Etta Quill, DO       Current Outpatient Prescriptions  Medication Sig Dispense Refill  . aspirin 325 MG tablet Take 325 mg by mouth daily.      . clonazePAM (KLONOPIN) 1 MG tablet Take 1 mg by mouth 2 (two) times daily as needed. For anxiety      . IRON PO Take by mouth.      Marland Kitchen lisinopril (PRINIVIL,ZESTRIL) 40 MG tablet Take 40 mg by mouth daily.      . metoprolol succinate (TOPROL-XL) 50 MG 24 hr tablet Take 50 mg by mouth at bedtime. Take with or immediately following a meal.      . paroxetine mesylate (PEXEVA) 40 MG tablet Take 40 mg by mouth every morning.      . polycarbophil (FIBERCON) 625 MG tablet Take 625 mg by mouth daily as needed. For fiber intake      .  senna-docusate (SENOKOT-S) 8.6-50 MG per tablet Take 1 tablet by mouth daily as needed. For constipation      . zolpidem (AMBIEN) 10 MG tablet Take 10 mg by mouth at bedtime. scheduled          ROS:  History obtained from the patient  General ROS: negative for - chills, fatigue, fever, night sweats, weight gain or weight loss Psychological ROS: negative for - behavioral disorder, hallucinations, memory difficulties, mood swings or suicidal ideation Ophthalmic ROS: negative for - blurry vision, double vision, eye pain or loss of vision ENT ROS: negative for - epistaxis, nasal discharge, oral lesions, sore throat, tinnitus or vertigo Allergy and Immunology ROS: negative for - hives or itchy/watery eyes Hematological and Lymphatic ROS: negative for - bleeding problems, bruising or swollen lymph nodes Endocrine ROS: negative for - galactorrhea, hair pattern changes, polydipsia/polyuria or temperature intolerance Respiratory ROS: negative for - cough, hemoptysis, shortness of breath or wheezing Cardiovascular ROS: negative for - chest pain, dyspnea on exertion, edema or irregular heartbeat Gastrointestinal ROS: negative for - abdominal pain, diarrhea, hematemesis, nausea/vomiting or stool incontinence Genito-Urinary ROS: negative for - dysuria, hematuria, incontinence or urinary frequency/urgency Musculoskeletal ROS: positive for - joint pain Neurological ROS: as noted in HPI Dermatological ROS: negative for rash and skin lesion changes   Blood pressure 184/104, pulse 55, temperature 98.4 F (36.9 C), temperature source Oral, resp. rate 13, SpO2 99.00%.   Neurologic Examination:                                                                                                      Mental Status: Alert, oriented, thought content appropriate.  Speech fluent without  evidence of aphasia.  Able to follow 3 step commands without difficulty. Cranial Nerves: II: Discs flat bilaterally; Visual fields grossly normal, pupils equal, round, reactive to light and accommodation III,IV, VI: ptosis not present, extra-ocular motions intact bilaterally V,VII: smile asymmetric along with forehead furrow due to previous bell's palsy. facial light touch sensation normal bilaterally VIII: hearing normal bilaterally IX,X: gag reflex present XI: bilateral shoulder shrug XII: midline tongue extension Motor: Right : Upper extremity   5/5    Left:     Upper extremity   5/5  Lower extremity   5/5     Lower extremity   5/5 Tone and bulk:normal tone throughout; no atrophy noted Sensory: Pinprick and light touch intact throughout, bilaterally Deep Tendon Reflexes: 2+ and symmetric throughout Plantars: Right: downgoing   Left: downgoing Cerebellar: normal finger-to-nose,  normal heel-to-shin test CV: pulses palpable throughout     Lab Results  Component Value Date/Time   CHOL  Value: 233        ATP III CLASSIFICATION:  <200     mg/dL   Desirable  200-239  mg/dL   Borderline High  >=240    mg/dL   High       * 07/24/2008  4:15 AM    Results for orders placed during the hospital encounter of 02/27/12 (from the past 48 hour(s))  CBC     Status: Normal   Collection Time   02/27/12 12:25 AM      Component Value Range Comment   WBC 6.0  4.0 - 10.5 K/uL    RBC 4.55  4.22 - 5.81 MIL/uL    Hemoglobin 13.9  13.0 - 17.0 g/dL  HCT 39.1  39.0 - 52.0 %    MCV 85.9  78.0 - 100.0 fL    MCH 30.5  26.0 - 34.0 pg    MCHC 35.5  30.0 - 36.0 g/dL    RDW 14.1  11.5 - 15.5 %    Platelets 192  150 - 400 K/uL   POCT I-STAT, CHEM 8     Status: Abnormal   Collection Time   02/27/12 12:33 AM      Component Value Range Comment   Sodium 141  135 - 145 mEq/L    Potassium 3.4 (*) 3.5 - 5.1 mEq/L    Chloride 103  96 - 112 mEq/L    BUN 13  6 - 23 mg/dL    Creatinine, Ser 1.00  0.50 - 1.35  mg/dL    Glucose, Bld 100 (*) 70 - 99 mg/dL    Calcium, Ion 1.09 (*) 1.12 - 1.23 mmol/L    TCO2 26  0 - 100 mmol/L    Hemoglobin 14.3  13.0 - 17.0 g/dL    HCT 42.0  39.0 - 52.0 %   URINE RAPID DRUG SCREEN (HOSP PERFORMED)     Status: Normal   Collection Time   02/27/12  2:52 AM      Component Value Range Comment   Opiates NONE DETECTED  NONE DETECTED    Cocaine NONE DETECTED  NONE DETECTED    Benzodiazepines NONE DETECTED  NONE DETECTED    Amphetamines NONE DETECTED  NONE DETECTED    Tetrahydrocannabinol NONE DETECTED  NONE DETECTED    Barbiturates NONE DETECTED  NONE DETECTED   COMPREHENSIVE METABOLIC PANEL     Status: Abnormal   Collection Time   02/27/12  2:53 AM      Component Value Range Comment   Sodium 138  135 - 145 mEq/L    Potassium 3.5  3.5 - 5.1 mEq/L    Chloride 101  96 - 112 mEq/L    CO2 29  19 - 32 mEq/L    Glucose, Bld 107 (*) 70 - 99 mg/dL    BUN 13  6 - 23 mg/dL    Creatinine, Ser 0.87  0.50 - 1.35 mg/dL    Calcium 9.0  8.4 - 10.5 mg/dL    Total Protein 7.6  6.0 - 8.3 g/dL    Albumin 3.4 (*) 3.5 - 5.2 g/dL    AST 18  0 - 37 U/L    ALT 13  0 - 53 U/L    Alkaline Phosphatase 62  39 - 117 U/L    Total Bilirubin 0.4  0.3 - 1.2 mg/dL    GFR calc non Af Amer >90  >90 mL/min    GFR calc Af Amer >90  >90 mL/min   SEDIMENTATION RATE     Status: Abnormal   Collection Time   02/27/12  2:53 AM      Component Value Range Comment   Sed Rate 35 (*) 0 - 16 mm/hr   TSH     Status: Abnormal   Collection Time   02/27/12  2:53 AM      Component Value Range Comment   TSH 6.875 (*) 0.350 - 4.500 uIU/mL   TROPONIN I     Status: Normal   Collection Time   02/27/12  2:53 AM      Component Value Range Comment   Troponin I <0.30  <0.30 ng/mL   PROTIME-INR     Status: Normal   Collection Time  02/27/12  2:53 AM      Component Value Range Comment   Prothrombin Time 12.6  11.6 - 15.2 seconds    INR 0.95  0.00 - 1.49   CBC     Status: Abnormal   Collection Time    02/27/12  6:42 AM      Component Value Range Comment   WBC 7.3  4.0 - 10.5 K/uL    RBC 4.41  4.22 - 5.81 MIL/uL    Hemoglobin 13.4  13.0 - 17.0 g/dL    HCT 38.5 (*) 39.0 - 52.0 %    MCV 87.3  78.0 - 100.0 fL    MCH 30.4  26.0 - 34.0 pg    MCHC 34.8  30.0 - 36.0 g/dL    RDW 14.4  11.5 - 15.5 %    Platelets 192  150 - 400 K/uL   CREATININE, SERUM     Status: Normal   Collection Time   02/27/12  6:42 AM      Component Value Range Comment   Creatinine, Ser 0.78  0.50 - 1.35 mg/dL    GFR calc non Af Amer >90  >90 mL/min    GFR calc Af Amer >90  >90 mL/min     Ct Head Wo Contrast  02/27/2012  *RADIOLOGY REPORT*  Clinical Data: Seizures and confusion.  CT HEAD WITHOUT CONTRAST  Technique:  Contiguous axial images were obtained from the base of the skull through the vertex without contrast.  Comparison: None.  Findings: Patchy low attenuation changes in the basal ganglia bilaterally but more prominent on the right.  This suggest old lacunar infarcts.  Acute lacunar infarct is not definitively excluded.  No mass effect or midline shift.  No abnormal extra- axial fluid collections.  Gray-white matter junctions are distinct. Basal cisterns are not effaced.  No acute intracranial hemorrhage. No ventricular dilatation.  No depressed skull fractures. Visualized paranasal sinuses and mastoid air cells are not opacified.  IMPRESSION: Low attenuation changes in the basal ganglia, greatest on the right, likely to represent old lacunar infarcts of acute infarction not entirely excluded.  No acute intracranial hemorrhage or mass effect.   Original Report Authenticated By: Lucienne Capers, M.D.    Mr Brain Wo Contrast  02/27/2012  *RADIOLOGY REPORT*  Clinical Data: History of seizures.  No seizure since 2008. Noncompliant with seizure medication and had a seizure last evening.  Hypertension.  MRI HEAD WITHOUT CONTRAST  Technique:  Multiplanar, multiecho pulse sequences of the brain and surrounding structures  were obtained according to standard protocol without intravenous contrast.  Comparison: 02/27/2012 head CT.  No comparison brain MR.  Findings: Motion degraded exam.  No acute infarct.  No intracranial hemorrhage.  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 Scattered nonspecific white matter type changes probably related to result of prior ischemia/small vessel disease given the surrounding findings and history of hypertension.  No intracranial mass lesion detected on this unenhanced exam.  No findings of mesial temporal sclerosis.  Major intracranial vascular structures are patent.  Right parotid lesion measures up to 1.2 cm possibly an intraparotid lymph node although primary parotid lesion not entirely excluded.  C2-3 mild bulge.  Cervical medullary junction, pituitary region, pineal region and orbital structures unremarkable.  Minimal paranasal sinus mucosal thickening.  Partial opacification inferior aspect left mastoid air cells.  No mass seen in the region of the posterior-superior nasopharynx causing eustachian tube dysfunction.  IMPRESSION: Mild motion degraded exam.  No acute infarct.  No  intracranial hemorrhage.  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 Scattered nonspecific white matter type changes probably related to result of prior ischemia/small vessel disease given the surrounding findings and history of hypertension.  No intracranial mass lesion detected on this unenhanced exam.  No findings of mesial temporal sclerosis.  Right parotid lesion measures up to 1.2 cm possibly an intraparotid lymph node although primary parotid lesion not entirely excluded.  C2-3 mild bulge.  Minimal paranasal sinus mucosal thickening.  Partial opacification inferior aspect left mastoid air cells.   Original Report Authenticated By: Genia Del, M.D.      Assessment/Plan:  51 YO male with known seizure disorder who experienced generalized seizure yesterday.  In the  past he has been on Keppra (unknown dose) with no SE but did not continue secondary to cost. MRI brain shows no acute infarct and patient is now back to baseline.   Recommend: 1) Keppra 500 mg BID 2) Social work to help with financial assistance to afford medication 3) No driving, operating heavy machinery, perform activities at heights, swimming or participation in water activities until release by outpatient physician.  This has been discussed with patient.    Etta Quill PA-C [facility] [phone]  I personally in spite of this patient's care and agree with clinical assessment and management recommendations.  Rush Farmer M.D. [facility] [phone]  02/27/2012, 11:06 AM

## 2012-02-27 NOTE — ED Notes (Signed)
[id number] Expected date:<BR> Expected time:<BR> Means of arrival:<BR> Comments:<BR> EMS

## 2012-02-27 NOTE — ED Notes (Signed)
Pt reports that he was taking Depakote, but stopped about  3 years ago and has not taking any since

## 2012-02-27 NOTE — ED Notes (Signed)
Patient transported to MRI 

## 2012-02-27 NOTE — ED Notes (Signed)
Pt back from MRI 

## 2012-02-27 NOTE — Care Management Note (Signed)
    Page 1 of 1   02/28/2012     2:12:15 PM   CARE MANAGEMENT NOTE 02/28/2012  Patient:  Frank Carey, Frank Carey   Account Number:  [id number]  Date Initiated:  02/27/2012  Documentation initiated by:  Dessa Phi  Subjective/Objective Assessment:   ADMITTED W/SEIZURE. [id number].     Action/Plan:   FROM HOME W/SPOUSE.HAS PCP,PHARMACY.   Anticipated DC Date:  02/28/2012   Anticipated DC Plan:  HOME/SELF CARE  In-house referral  Financial Counselor      DC Planning Services  CM consult  [facility] [facility]      Choice offered to / List presented to:             Status of service:  Completed, signed off Medicare Important Message given?   (If response is "NO", the following Medicare IM given date fields will be blank) Date Medicare IM given:   Date Additional Medicare IM given:    Discharge Disposition:  HOME/SELF CARE  Per UR Regulation:  Reviewed for med. necessity/level of care/duration of stay  If discussed at Long Length of Stay Meetings, dates discussed:    Comments:  02/28/12 Frank Dugar RN,BSN NCM [phone] MED[facility],OR OVER THE COUNTER,& $3 CO-PAY FOR EACH SCRIPT.ALSO INFORMED SPOUSE TO USE RESOURCES FOR A PCP.MD UPDATED.  02/27/12 Frank Hannis RN,BSN NCM [id number] SPOKE TO SPOUSE ABOUT DISABILITY, HEALTH INSURANCE RESOURCES.INFORMED OF FINANCIAL COUNSELOR TO ASSIST W/RESOURCES, & DEPT OF SOCIAL SERVICES-DISABILITY/HEALTH INSURANCE.PROVIDED W/[facility] $4 MED LIST.

## 2012-02-28 LAB — BASIC METABOLIC PANEL
BUN: 10 mg/dL (ref 6–23)
GFR calc non Af Amer: >90 mL/min (ref >90)
Glucose, Bld: 93 mg/dL (ref 70–99)
Potassium: 3.2 mEq/L — ABNORMAL LOW (ref 3.5–5.1)

## 2012-02-28 LAB — T4, FREE: Free T4: 0.88 ng/dL (ref 0.80–1.80)

## 2012-02-28 LAB — CBC
HCT: 39.2 % (ref 39.0–52.0)
Hemoglobin: 13.6 g/dL (ref 13.0–17.0)
MCHC: 34.7 g/dL (ref 30.0–36.0)

## 2012-02-28 MED ORDER — POTASSIUM CHLORIDE CRYS ER 20 MEQ PO TBCR
40.0000 meq | EXTENDED_RELEASE_TABLET | Freq: Once | ORAL | Status: AC
  Administered 2012-02-28: 40 meq via ORAL
  Filled 2012-02-28: qty 2

## 2012-02-28 MED ORDER — METOPROLOL TARTRATE 12.5 MG HALF TABLET
12.5000 mg | ORAL_TABLET | Freq: Two times a day (BID) | ORAL | Status: DC
  Administered 2012-02-28: 12.5 mg via ORAL
  Filled 2012-02-28 (×2): qty 1

## 2012-02-28 MED ORDER — SPIRONOLACTONE 25 MG PO TABS: 25.0000 mg | ORAL_TABLET | Freq: Every day | ORAL | Status: AC

## 2012-02-28 MED ORDER — LEVETIRACETAM 500 MG PO TABS: 500.0000 mg | ORAL_TABLET | Freq: Two times a day (BID) | ORAL | Status: DC

## 2012-02-28 MED ORDER — LISINOPRIL 40 MG PO TABS: 40.0000 mg | ORAL_TABLET | Freq: Every day | ORAL | Status: DC

## 2012-02-28 MED ORDER — AMLODIPINE BESYLATE 10 MG PO TABS
10.0000 mg | ORAL_TABLET | Freq: Every day | ORAL | Status: DC
  Administered 2012-02-28: 10 mg via ORAL
  Filled 2012-02-28: qty 1

## 2012-02-28 MED ORDER — AMLODIPINE BESYLATE 10 MG PO TABS: 10.0000 mg | ORAL_TABLET | Freq: Every day | ORAL | Status: AC

## 2012-02-28 MED ORDER — ZOLPIDEM TARTRATE 10 MG PO TABS: 10.0000 mg | ORAL_TABLET | Freq: Every evening | ORAL | Status: AC | PRN

## 2012-02-28 MED ORDER — SPIRONOLACTONE 25 MG PO TABS
25.0000 mg | ORAL_TABLET | Freq: Every day | ORAL | Status: DC
  Administered 2012-02-28: 25 mg via ORAL
  Filled 2012-02-28: qty 1

## 2012-02-28 NOTE — Progress Notes (Signed)
Paged physician on call regarding pt BP. Ellaree Gear RN

## 2012-02-28 NOTE — Discharge Summary (Signed)
Physician Discharge Summary  Frank Carey [id number] DOB: 29-Nov-1960 DOA: 02/27/2012  PCP: Nilda Simmer, MD  Admit date: 02/27/2012 Discharge date: 02/28/2012  Time spent: 40 minutes  Recommendations for Outpatient Follow-up:  Please followup with PCP in 1-2 week. Please have your PCP check your blood pressure and heart rate at the next clinic visit.  Please followup with neurology in 1-2 months. Do not drive for 6 months until you are seizure free and are clears to drive by a physician.  Discharge Diagnoses:  Active Problems:  Seizure  HTN (hypertension)  Raynaud phenomenon  Discharge Condition: Stable  Diet recommendation: Heart healthy diet  Filed Weights   02/27/12 1300  Weight: 97.977 kg (216 lb)    History of present illness:  Frank Carey is a 51 y.o. male who presents to the ED after a tonic-clonic seizure on 02/27/2012.  Hospital Course:  Seizure  Has a longstanding known seizure disorder. Patient reports being on seizure medications in the past but due to side effects has not been taking them. 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 EEG on 02/27/2012 was a normal EEG. Neurology Dr. Nicole Kindred evaluated the patient. Patient started on keppra 500 mg po BID for his seizures. Instructed the patient that he cannot drive for at least 6 months since 02/27/2012. Given the history of symptoms when raising his arms above his head, upper extremity arterial duplex on 02/27/2012 not suggestive of subclavian steal.  HTN  Not well controlled. Continue lisinopril. Amlodipine and spironolactone added to his regimen. Decreased and discontinued the dose of metoprolol due to bradycardia. Continue PRN hydralazine for very high BP in ED.   Bradycardia  Was weaned and discontinued off of metoprolol prior to discharge.   Raynaud's phenomenon ESR elevated at 35. CRP low at <0.5. Cold agglutinin negative. Will  need to followup with PCP for closer monitoring.  Suspect patient likely has Raynaud's phenomena as he reports his toes becoming blue/purple in color with exposure to cold and improving with warm water.  Low suspicion for vasculitis.  Wife was called and informed of the results of the cold agglutinin.  Hypokalemia  Replace as needed.   Abnormal TSH  Free T4 normal.  Wife was called and informed of the results.  Consultants:  Neurology Dr. Nicole Kindred  Procedures:  EEG on 02/27/2012  Normal EEG.  Upper extremity arterial duplex on 02/27/2012  Preliminary findings: Bilaterally normal arterial flow in the vertebral, subclavian, and brachial arteries at rest and with arms lifted above head. Bilateral symmetrical BP's. Not suggestive of subclavian steal.   Antibiotics:  None.   Discharge Exam: Filed Vitals:   02/28/12 0547 02/28/12 0549 02/28/12 1058 02/28/12 1317  BP: 199/103 204/100 155/72 150/97  Pulse: 46  56 46  Temp: 97.8 F (36.6 C)  97.9 F (36.6 C) 98 F (36.7 C)  TempSrc: Oral  Oral Oral  Resp: 18   16  Height:      Weight:      SpO2: 95%   100%   Discharge Instructions  Discharge Orders    Future Orders Please Complete By Expires   Diet - low sodium heart healthy      Increase activity slowly      Discharge instructions      Comments:   Please followup with PCP in 1-2 week. Please have your PCP check your blood pressure and heart rate at the next clinic visit. Please have your PCP followup on  pending results.  Please followup with neurology in 1-2 months.  Do not drive for 6 months until you are seizure free and are clears to drive by a physician.       Medication List     As of 02/28/2012  2:18 PM    STOP taking these medications         metoprolol succinate 50 MG 24 hr tablet   Commonly known as: TOPROL-XL      TAKE these medications         amLODipine 10 MG tablet   Commonly known as: NORVASC   Take 1 tablet (10 mg total) by mouth daily.       aspirin 325 MG tablet   Take 325 mg by mouth daily.      clonazePAM 1 MG tablet   Commonly known as: KLONOPIN   Take 1 mg by mouth 2 (two) times daily as needed. For anxiety      IRON PO   Take by mouth.      levETIRAcetam 500 MG tablet   Commonly known as: KEPPRA   Take 1 tablet (500 mg total) by mouth 2 (two) times daily.      lisinopril 40 MG tablet   Commonly known as: PRINIVIL,ZESTRIL   Take 1 tablet (40 mg total) by mouth daily.      paroxetine mesylate 40 MG tablet   Commonly known as: PEXEVA   Take 40 mg by mouth every morning.      polycarbophil 625 MG tablet   Commonly known as: FIBERCON   Take 625 mg by mouth daily as needed. For fiber intake      senna-docusate 8.6-50 MG per tablet   Commonly known as: Senokot-S   Take 1 tablet by mouth daily as needed. For constipation      spironolactone 25 MG tablet   Commonly known as: ALDACTONE   Take 1 tablet (25 mg total) by mouth daily.      zolpidem 10 MG tablet   Commonly known as: AMBIEN   Take 1 tablet (10 mg total) by mouth at bedtime as needed for sleep. scheduled           Follow-up Information    Follow up with PCP. Schedule an appointment as soon as possible for a visit in 2 weeks.      Follow up with [facility]. Schedule an appointment as soon as possible for a visit in 3 weeks.   Contact information:   [address] [place] [phone]          The results of significant diagnostics from this hospitalization (including imaging, microbiology, ancillary and laboratory) are listed below for reference.    Significant Diagnostic Studies: Ct Head Wo Contrast  02/27/2012  *RADIOLOGY REPORT*  Clinical Data: Seizures and confusion.  CT HEAD WITHOUT CONTRAST  Technique:  Contiguous axial images were obtained from the base of the skull through the vertex without contrast.  Comparison: None.  Findings: Patchy low attenuation changes in the basal  ganglia bilaterally but more prominent on the right.  This suggest old lacunar infarcts.  Acute lacunar infarct is not definitively excluded.  No mass effect or midline shift.  No abnormal extra- axial fluid collections.  Gray-white matter junctions are distinct. Basal cisterns are not effaced.  No acute intracranial hemorrhage. No ventricular dilatation.  No depressed skull fractures. Visualized paranasal sinuses and mastoid air cells are not opacified.  IMPRESSION: Low attenuation changes  in the basal ganglia, greatest on the right, likely to represent old lacunar infarcts of acute infarction not entirely excluded.  No acute intracranial hemorrhage or mass effect.   Original Report Authenticated By: Lucienne Capers, M.D.    Mr Brain Wo Contrast  02/27/2012  *RADIOLOGY REPORT*  Clinical Data: History of seizures.  No seizure since 2008. Noncompliant with seizure medication and had a seizure last evening.  Hypertension.  MRI HEAD WITHOUT CONTRAST  Technique:  Multiplanar, multiecho pulse sequences of the brain and surrounding structures were obtained according to standard protocol without intravenous contrast.  Comparison: 02/27/2012 head CT.  No comparison brain MR.  Findings: Motion degraded exam.  No acute infarct.  No intracranial hemorrhage.  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 Scattered nonspecific white matter type changes probably related to result of prior ischemia/small vessel disease given the surrounding findings and history of hypertension.  No intracranial mass lesion detected on this unenhanced exam.  No findings of mesial temporal sclerosis.  Major intracranial vascular structures are patent.  Right parotid lesion measures up to 1.2 cm possibly an intraparotid lymph node although primary parotid lesion not entirely excluded.  C2-3 mild bulge.  Cervical medullary junction, pituitary region, pineal region and orbital structures unremarkable.  Minimal paranasal sinus  mucosal thickening.  Partial opacification inferior aspect left mastoid air cells.  No mass seen in the region of the posterior-superior nasopharynx causing eustachian tube dysfunction.  IMPRESSION: Mild motion degraded exam.  No acute infarct.  No intracranial hemorrhage.  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 Scattered nonspecific white matter type changes probably related to result of prior ischemia/small vessel disease given the surrounding findings and history of hypertension.  No intracranial mass lesion detected on this unenhanced exam.  No findings of mesial temporal sclerosis.  Right parotid lesion measures up to 1.2 cm possibly an intraparotid lymph node although primary parotid lesion not entirely excluded.  C2-3 mild bulge.  Minimal paranasal sinus mucosal thickening.  Partial opacification inferior aspect left mastoid air cells.   Original Report Authenticated By: Genia Del, M.D.     Microbiology: No results found for this or any previous visit (from the past 240 hour(s)).   Labs: Basic Metabolic Panel:  Lab [id number] 0430 02/27/12 0642 02/27/12 0253 02/27/12 0033  NA 140 -- 138 141  K 3.2* -- 3.5 3.4*  CL 102 -- 101 103  CO2 31 -- 29 --  GLUCOSE 93 -- 107* 100*  BUN 10 -- 13 13  CREATININE 0.84 0.78 0.87 1.00  CALCIUM 9.3 -- 9.0 --  MG 1.9 -- -- --  PHOS -- -- -- --   Liver Function Tests:  Lab 02/27/12 0253  AST 18  ALT 13  ALKPHOS 62  BILITOT 0.4  PROT 7.6  ALBUMIN 3.4*   No results found for this basename: LIPASE:5,AMYLASE:5 in the last 168 hours No results found for this basename: AMMONIA:5 in the last 168 hours CBC:  Lab 02/28/12 0430 02/27/12 0642 02/27/12 0033 02/27/12 0025  WBC 6.8 7.3 -- 6.0  NEUTROABS -- -- -- --  HGB 13.6 13.4 14.3 13.9  HCT 39.2 38.5* 42.0 39.1  MCV 87.5 87.3 -- 85.9  PLT 190 192 -- 192   Cardiac Enzymes:  Lab 02/27/12 0253  CKTOTAL --  CKMB --  CKMBINDEX --  TROPONINI <0.30   BNP: BNP (last 3  results) No results found for this basename: PROBNP:3 in the last 8760 hours CBG: No results found for this basename:  GLUCAP:5 in the last 168 hours   Signed:  Hebah Bogosian A  [facility] 02/28/2012, 2:18 PM

## 2012-02-28 NOTE — Progress Notes (Addendum)
[facility] PROGRESS NOTE  Frank Carey [id number] DOB: Nov 21, 1960 DOA: 02/27/2012 PCP: Nilda Simmer, MD  Assessment/Plan: Seizure Has a longstanding known seizure disorder. Patient reports being on seizure medications in the past but due to side effects has not been taking them. 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 EEG done with results pending. Appreciate neurology evaluation. Patient started on keppra 500 mg po BID for his seizures. Instructed the patient that he cannot drive for at least 6 months since 02/27/2012. Given the history of symptoms when raising his arms above his head, upper extremity arterial duplex on 02/27/2012 not suggestive of subclavian steal. Seizure precautions.  HTN Not well controlled. Continue lisinopril. Amlodipine and spironolactone added to his regimen. Decreased dose of metoprolol due to bradycardia. Continue PRN hydralazine for very high BP in ED.  Bradycardia Decreased dose of metoprolol even further.  Raynaud's phenomenon Vs vasculitis ESR elevated at 35. CRP low at <0.5. Cold agglutinin pending. Continue to monitor. Will need to followup with PCP for closer monitoring.  Hypokalemia Replace as needed.  Abnormal TSH Check free T4  Code Status: Full Code Family Communication: Patient updated at bedside. No family at bedside.  Disposition Plan: Pending improvement of blood pressure and EEG results.  Consultants:  Neurology Dr. Nicole Kindred  Procedures: MRI brain on 02/27/2012 Study conclusion: Mild motion degraded exam. No acute infarct. No intracranial hemorrhage. 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 Scattered nonspecific white matter type changes probably related to result of prior ischemia/small vessel disease given the surrounding findings and history of hypertension. No intracranial mass lesion detected on this unenhanced exam. No findings  of mesial temporal sclerosis. Right parotid lesion measures up to 1.2 cm possibly an intraparotid lymph node although primary parotid lesion not entirely excluded. C2-3 mild bulge. Minimal paranasal sinus mucosal thickening. Partial opacification inferior aspect left mastoid air cells.  EEG on 02/27/2012 Pending.  Upper extremity arterial duplex on 02/27/2012 Preliminary findings: Bilaterally normal arterial flow in the vertebral, subclavian, and brachial arteries at rest and with arms lifted above head. Bilateral symmetrical BP's. Not suggestive of subclavian steal.  Antibiotics:  None.   HPI/Subjective: Complaining of a headache this morning. No other specific concerns.  Objective: Filed Vitals:   02/27/12 2227 02/27/12 2228 02/28/12 0547 02/28/12 0549  BP: 192/86 201/85 199/103 204/100  Pulse: 45  46   Temp: 98.9 F (37.2 C)  97.8 F (36.6 C)   TempSrc: Oral  Oral   Resp: 18  18   Height:      Weight:      SpO2: 98%  95%     Intake/Output Summary (Last 24 hours) at 02/28/12 0807 Last data filed at 02/28/12 0528  Gross per 24 hour  Intake    720 ml  Output    825 ml  Net   -105 ml   Filed Weights   02/27/12 1300  Weight: 97.977 kg (216 lb)    Exam: Physical Exam: General: Awake, Oriented, No acute distress. HEENT: EOMI. Neck: Supple CV: S1 and S2 Lungs: Clear to ascultation bilaterally Abdomen: Soft, Nontender, Nondistended, +bowel sounds. Ext: Good pulses. Trace edema.  Data Reviewed: Basic Metabolic Panel:  Lab [id number] 0430 02/27/12 0642 02/27/12 0253 02/27/12 0033  NA 140 -- 138 141  K 3.2* -- 3.5 3.4*  CL 102 -- 101 103  CO2 31 -- 29 --  GLUCOSE 93 -- 107* 100*  BUN 10 -- 13 13  CREATININE  0.84 0.78 0.87 1.00  CALCIUM 9.3 -- 9.0 --  MG 1.9 -- -- --  PHOS -- -- -- --   Liver Function Tests:  Lab 02/27/12 0253  AST 18  ALT 13  ALKPHOS 62  BILITOT 0.4  PROT 7.6  ALBUMIN 3.4*   No results found for this basename: LIPASE:5,AMYLASE:5 in  the last 168 hours No results found for this basename: AMMONIA:5 in the last 168 hours CBC:  Lab 02/28/12 0430 02/27/12 0642 02/27/12 0033 02/27/12 0025  WBC 6.8 7.3 -- 6.0  NEUTROABS -- -- -- --  HGB 13.6 13.4 14.3 13.9  HCT 39.2 38.5* 42.0 39.1  MCV 87.5 87.3 -- 85.9  PLT 190 192 -- 192   Cardiac Enzymes:  Lab 02/27/12 0253  CKTOTAL --  CKMB --  CKMBINDEX --  TROPONINI <0.30   BNP (last 3 results) No results found for this basename: PROBNP:3 in the last 8760 hours CBG: No results found for this basename: GLUCAP:5 in the last 168 hours  No results found for this or any previous visit (from the past 240 hour(s)).   Studies: Ct Head Wo Contrast  02/27/2012  *RADIOLOGY REPORT*  Clinical Data: Seizures and confusion.  CT HEAD WITHOUT CONTRAST  Technique:  Contiguous axial images were obtained from the base of the skull through the vertex without contrast.  Comparison: None.  Findings: Patchy low attenuation changes in the basal ganglia bilaterally but more prominent on the right.  This suggest old lacunar infarcts.  Acute lacunar infarct is not definitively excluded.  No mass effect or midline shift.  No abnormal extra- axial fluid collections.  Gray-white matter junctions are distinct. Basal cisterns are not effaced.  No acute intracranial hemorrhage. No ventricular dilatation.  No depressed skull fractures. Visualized paranasal sinuses and mastoid air cells are not opacified.  IMPRESSION: Low attenuation changes in the basal ganglia, greatest on the right, likely to represent old lacunar infarcts of acute infarction not entirely excluded.  No acute intracranial hemorrhage or mass effect.   Original Report Authenticated By: Lucienne Capers, M.D.    Mr Brain Wo Contrast  02/27/2012  *RADIOLOGY REPORT*  Clinical Data: History of seizures.  No seizure since 2008. Noncompliant with seizure medication and had a seizure last evening.  Hypertension.  MRI HEAD WITHOUT CONTRAST  Technique:   Multiplanar, multiecho pulse sequences of the brain and surrounding structures were obtained according to standard protocol without intravenous contrast.  Comparison: 02/27/2012 head CT.  No comparison brain MR.  Findings: Motion degraded exam.  No acute infarct.  No intracranial hemorrhage.  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 Scattered nonspecific white matter type changes probably related to result of prior ischemia/small vessel disease given the surrounding findings and history of hypertension.  No intracranial mass lesion detected on this unenhanced exam.  No findings of mesial temporal sclerosis.  Major intracranial vascular structures are patent.  Right parotid lesion measures up to 1.2 cm possibly an intraparotid lymph node although primary parotid lesion not entirely excluded.  C2-3 mild bulge.  Cervical medullary junction, pituitary region, pineal region and orbital structures unremarkable.  Minimal paranasal sinus mucosal thickening.  Partial opacification inferior aspect left mastoid air cells.  No mass seen in the region of the posterior-superior nasopharynx causing eustachian tube dysfunction.  IMPRESSION: Mild motion degraded exam.  No acute infarct.  No intracranial hemorrhage.  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 Scattered nonspecific white matter type changes probably related to result of prior ischemia/small  vessel disease given the surrounding findings and history of hypertension.  No intracranial mass lesion detected on this unenhanced exam.  No findings of mesial temporal sclerosis.  Right parotid lesion measures up to 1.2 cm possibly an intraparotid lymph node although primary parotid lesion not entirely excluded.  C2-3 mild bulge.  Minimal paranasal sinus mucosal thickening.  Partial opacification inferior aspect left mastoid air cells.   Original Report Authenticated By: Genia Del, M.D.     Scheduled Meds:    . [COMPLETED]  acetaminophen      . amLODipine  10 mg Oral Daily  . aspirin  325 mg Oral Daily  . heparin  5,000 Units Subcutaneous Q8H  . levETIRAcetam  500 mg Oral BID  . lisinopril  40 mg Oral Daily  . [EXPIRED] LORazepam      . [COMPLETED] LORazepam  1 mg Oral Once  . metoprolol tartrate  12.5 mg Oral BID  . PARoxetine  40 mg Oral Daily  . sodium chloride  3 mL Intravenous Q12H  . spironolactone  25 mg Oral Daily  . zolpidem  10 mg Oral QHS  . [DISCONTINUED] amLODipine  5 mg Oral Daily  . [DISCONTINUED] cloNIDine  0.2 mg Oral BID  . [DISCONTINUED] metoprolol succinate  25 mg Oral QHS  . [DISCONTINUED] metoprolol succinate  50 mg Oral QHS   Continuous Infusions:   Active Problems:  Seizure  HTN (hypertension)  Raynaud phenomenon    Time spent: [facility] Pager [phone]. If 8PM-8AM, please contact night-coverage at [email or web address], password [facility] 02/28/2012, 8:07 AM  LOS: 1 day

## 2012-02-28 NOTE — Progress Notes (Signed)
Pt noted with BP of 199/103 in left arm 200/100 in right arm while lying gave hydralazine 10mg  per order. Will advise oncoming nurse to continue to monitor if not relieved by hydralazine will need to contact physician. Merced Hanners RN

## 2012-02-28 NOTE — Procedures (Signed)
EEG NUMBER:  [id number].  REFERRING PHYSICIAN:  Lottie Dawson, MD  INDICATION FOR STUDY:  A 51 year old man admitted for recurrent seizure with a history of seizure disorder for 40 years.  DESCRIPTION:  This is a routine EEG recording performed during wakefulness.  Predominant background activity consisted of 9 hertz symmetrical alpha rhythm, which attenuated well with eye opening. Photic stimulation produced a symmetrical bilateral occipital driving response.  Hyperventilation produced a normal symmetrical generalized transient slowing response.  No epileptiform discharges were recorded.  INTERPRETATION:  This is a normal EEG recording.     Wallie Char, MD    [id number] D:  02/28/2012 07:29:07  T:  02/28/2012 09:02:59  Job #:  [id number]

## 2012-02-29 DIAGNOSIS — G40309 Generalized idiopathic epilepsy and epileptic syndromes, not intractable, without status epilepticus: Secondary | ICD-10-CM | POA: Diagnosis present

## 2013-01-03 ENCOUNTER — Encounter (HOSPITAL_BASED_OUTPATIENT_CLINIC_OR_DEPARTMENT_OTHER): Payer: Self-pay | Admitting: Emergency Medicine

## 2013-01-03 ENCOUNTER — Emergency Department (HOSPITAL_BASED_OUTPATIENT_CLINIC_OR_DEPARTMENT_OTHER)
Admission: EM | Admit: 2013-01-03 | Discharge: 2013-01-04 | Disposition: A | Discharge status: Home / Self Care | Payer: Medicaid Other | Attending: Emergency Medicine | Admitting: Emergency Medicine

## 2013-01-03 DIAGNOSIS — S43402A Unspecified sprain of left shoulder joint, initial encounter: Secondary | ICD-10-CM

## 2013-01-03 DIAGNOSIS — Z7982 Long term (current) use of aspirin: Secondary | ICD-10-CM | POA: Insufficient documentation

## 2013-01-03 DIAGNOSIS — Y9389 Activity, other specified: Secondary | ICD-10-CM | POA: Insufficient documentation

## 2013-01-03 DIAGNOSIS — Y929 Unspecified place or not applicable: Secondary | ICD-10-CM | POA: Insufficient documentation

## 2013-01-03 DIAGNOSIS — Z9861 Coronary angioplasty status: Secondary | ICD-10-CM | POA: Insufficient documentation

## 2013-01-03 DIAGNOSIS — Z880 Allergy status to penicillin: Secondary | ICD-10-CM | POA: Insufficient documentation

## 2013-01-03 DIAGNOSIS — I2510 Atherosclerotic heart disease of native coronary artery without angina pectoris: Secondary | ICD-10-CM | POA: Insufficient documentation

## 2013-01-03 DIAGNOSIS — S3981XA Other specified injuries of abdomen, initial encounter: Secondary | ICD-10-CM | POA: Insufficient documentation

## 2013-01-03 DIAGNOSIS — G4700 Insomnia, unspecified: Secondary | ICD-10-CM | POA: Insufficient documentation

## 2013-01-03 DIAGNOSIS — Z951 Presence of aortocoronary bypass graft: Secondary | ICD-10-CM | POA: Insufficient documentation

## 2013-01-03 DIAGNOSIS — G40909 Epilepsy, unspecified, not intractable, without status epilepticus: Secondary | ICD-10-CM | POA: Insufficient documentation

## 2013-01-03 DIAGNOSIS — F17200 Nicotine dependence, unspecified, uncomplicated: Secondary | ICD-10-CM | POA: Insufficient documentation

## 2013-01-03 DIAGNOSIS — F411 Generalized anxiety disorder: Secondary | ICD-10-CM | POA: Insufficient documentation

## 2013-01-03 DIAGNOSIS — S90851A Superficial foreign body, right foot, initial encounter: Secondary | ICD-10-CM

## 2013-01-03 DIAGNOSIS — S91309A Unspecified open wound, unspecified foot, initial encounter: Secondary | ICD-10-CM | POA: Insufficient documentation

## 2013-01-03 DIAGNOSIS — IMO0002 Reserved for concepts with insufficient information to code with codable children: Secondary | ICD-10-CM | POA: Insufficient documentation

## 2013-01-03 DIAGNOSIS — Z79899 Other long term (current) drug therapy: Secondary | ICD-10-CM | POA: Insufficient documentation

## 2013-01-03 DIAGNOSIS — S298XXA Other specified injuries of thorax, initial encounter: Secondary | ICD-10-CM | POA: Insufficient documentation

## 2013-01-03 DIAGNOSIS — T07XXXA Unspecified multiple injuries, initial encounter: Secondary | ICD-10-CM | POA: Insufficient documentation

## 2013-01-03 DIAGNOSIS — Z9889 Other specified postprocedural states: Secondary | ICD-10-CM | POA: Insufficient documentation

## 2013-01-03 DIAGNOSIS — I10 Essential (primary) hypertension: Secondary | ICD-10-CM | POA: Insufficient documentation

## 2013-01-03 DIAGNOSIS — S0993XA Unspecified injury of face, initial encounter: Secondary | ICD-10-CM | POA: Insufficient documentation

## 2013-01-03 HISTORY — DX: Atherosclerotic heart disease of native coronary artery without angina pectoris: I25.10
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
## 2013-01-04 ENCOUNTER — Emergency Department (HOSPITAL_BASED_OUTPATIENT_CLINIC_OR_DEPARTMENT_OTHER): Payer: Medicaid Other

## 2013-01-04 LAB — CBC WITH DIFFERENTIAL/PLATELET
Basophils Relative: 0 % (ref 0–1)
Eosinophils Absolute: 0.2 10*3/uL (ref 0.0–0.7)
HCT: 33.5 % — ABNORMAL LOW (ref 39.0–52.0)
Hemoglobin: 11.4 g/dL — ABNORMAL LOW (ref 13.0–17.0)
Lymphs Abs: 2.1 10*3/uL (ref 0.7–4.0)
MCH: 31.1 pg (ref 26.0–34.0)
MCHC: 34 g/dL (ref 30.0–36.0)
Monocytes Absolute: 0.7 10*3/uL (ref 0.1–1.0)
Monocytes Relative: 12 % (ref 3–12)
Neutro Abs: 2.5 10*3/uL (ref 1.7–7.7)
RBC: 3.67 MIL/uL — ABNORMAL LOW (ref 4.22–5.81)

## 2013-01-04 LAB — COMPREHENSIVE METABOLIC PANEL
ALT: 14 U/L (ref 0–53)
Albumin: 3.5 g/dL (ref 3.5–5.2)
Alkaline Phosphatase: 65 U/L (ref 39–117)
Potassium: 3.7 mEq/L (ref 3.5–5.1)
Sodium: 139 mEq/L (ref 135–145)
Total Protein: 8 g/dL (ref 6.0–8.3)

## 2013-01-04 MED ORDER — IOHEXOL 300 MG/ML  SOLN
100.0000 mL | Freq: Once | INTRAMUSCULAR | Status: AC | PRN
  Administered 2013-01-04: 100 mL via INTRAVENOUS

## 2013-01-04 MED ORDER — SODIUM CHLORIDE 0.9 % IV SOLN
INTRAVENOUS | Status: DC
  Administered 2013-01-04: 02:00:00 via INTRAVENOUS
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
## 2013-11-16 ENCOUNTER — Encounter: Payer: Self-pay | Admitting: *Deleted

## 2013-11-16 ENCOUNTER — Encounter: Payer: Self-pay | Admitting: Neurology

## 2013-11-16 ENCOUNTER — Ambulatory Visit (INDEPENDENT_AMBULATORY_CARE_PROVIDER_SITE_OTHER): Payer: Self-pay | Admitting: Neurology

## 2013-11-16 VITALS — BP 94/64 | HR 84 | Ht 74.0 in | Wt 225.6 lb

## 2013-11-16 DIAGNOSIS — G609 Hereditary and idiopathic neuropathy, unspecified: Secondary | ICD-10-CM | POA: Insufficient documentation

## 2013-11-16 DIAGNOSIS — F17200 Nicotine dependence, unspecified, uncomplicated: Secondary | ICD-10-CM

## 2013-11-16 DIAGNOSIS — R42 Dizziness and giddiness: Secondary | ICD-10-CM

## 2013-11-16 DIAGNOSIS — R569 Unspecified convulsions: Secondary | ICD-10-CM

## 2013-11-16 DIAGNOSIS — H8112 Benign paroxysmal vertigo, left ear: Secondary | ICD-10-CM

## 2013-11-16 DIAGNOSIS — H8110 Benign paroxysmal vertigo, unspecified ear: Secondary | ICD-10-CM

## 2013-11-16 MED ORDER — ONDANSETRON HCL 4 MG PO TABS: 4.0000 mg | ORAL_TABLET | Freq: Three times a day (TID) | ORAL | Status: DC | PRN

## 2013-11-16 MED ORDER — MECLIZINE HCL 25 MG PO TABS: 25.0000 mg | ORAL_TABLET | Freq: Three times a day (TID) | ORAL | Status: DC | PRN

## 2013-11-16 NOTE — Progress Notes (Signed)
[facility]    Provider:  Dr Jaynee Eagles Referring Provider: Delilah Shan, MD Primary Care Physician:  Velna Ochs, MD  CC:  dizzyness  HPI:  Frank Carey is a 53 y.o. male here as a referral from Dr. Claiborne Billings for dizzyness  53 year old who is here for evaluation of dizziness and vertigo for about a month. Triggers are movement, standing up. Room spinning, + nausea, + vomiting. No hearing fullness or hearing loss. He has the episodes daily. Stress makes it worse. Sitting down and not moving makes it better. Feels his eyes are getting worse. He has prescription glasses but doesn't like them cause they are progressives. Episodes last minutes to hours. He feels like he is being pushed down to the left. He has to lay still. Hasn't taken anything for it, has not taken meclizine. Takes benadryl few nights a week. Goes to bed. Eyes are very sensitive to light. Eye balls feel like they are twitching. He sees black spots. He is seeing yellow and red the size of confetti. No headache associated. 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 Is having fatigue in his face which reminds him of bells palsy. Sometimes when he is eating, something pops in his ear.  The vertigo started a month ago. Takes keppra for seizures. He had seizures as a teenager but last 2013 and is stable on Keppra. Smokes 1 pack daily. +HTN, + HLD, CAD.   Reviewed notes, labs and imaging from outside physicians, which showed that TSH lab was collected. Result not in notes but patient's wife states that recent TSH was elevated but FT4 was WNL  Review of Systems: Patient complains of symptoms per HPI as well as the following symptoms heat intolerance, loss of vision, excessive sweating, ringing in ears, spots, wheezing, chest tightness, leg swelling, heat intolerance, nausea, vomiting, restless leg, cooordination problem,memory loss, dizziness, speech difficulty, seizure disorder, agitation, confusion,  depression, nervous/anxious. Pertinent negatives per HPI. Otherwise out of a complete 14 system review, and all other reviewed systems are negative.   History   Social History  . Marital Status: Married    Spouse Name: N/A    Number of Children: 3  . Years of Education: 12   Occupational History  . Not on file.   Social History Main Topics  . Smoking status: Current Every Day Smoker -- 1.00 packs/day for 25 years    Types: Cigarettes  . Smokeless tobacco: Never Used  . Alcohol Use: 0.6 oz/week    1 Shots of liquor per week     Comment: weekly  . Drug Use: No     Comment: USED MARIJUANA 2 MONTHS AGO  . Sexual Activity: Not Currently   Other Topics Concern  . Not on file   Social History Narrative   Patient is married with 3 children.   Patient is right handed.   Patient has a high school education.   Patient drinks 5 or more.    Family History  Problem Relation Age of Onset  . Asthma Mother   . Asthma Daughter   . Coronary artery disease Mother   . Coronary artery disease Father   . Epilepsy Sister   . Epilepsy Paternal Grandmother   . Diabetes Maternal Grandmother   . Hypertension    . Heart disease Father   . Stroke Paternal Grandmother   . Stroke      uncle  . Stroke Maternal Grandmother  Past Medical History  Diagnosis Date  . Seizure   . Anxiety   . Insomnia   . Hypertension   . Coronary artery disease   . Unspecified fall   . Alcohol abuse, continuous   . Allergic rhinitis, cause unspecified   . Other chest pain   . Bell's palsy   . Brachial neuritis or radiculitis NOS   . Dizziness and giddiness   . Unspecified epilepsy without mention of intractable epilepsy   . Enthesopathy of hip region   . Pain in limb   . Dyspepsia and other specified disorders of function of stomach   . Ganglion of tendon sheath   . Headache(784.0)   . Internal hemorrhoids without mention of complication   . Lumbago   . Major depressive disorder, recurrent  episode, unspecified   . Nonallopathic lesion of sacral region, not elsewhere classified   . Pain in limb   . Tobacco use disorder   . Personal history of noncompliance with medical treatment, presenting hazards to health   . Psychosexual dysfunction with inhibited sexual excitement   . Sebaceous cyst   . Seborrheic dermatitis, unspecified   . Sialoadenitis   . Spontaneous ecchymoses   . Thoracic or lumbosacral neuritis or radiculitis, unspecified   . Disturbance of skin sensation     Past Surgical History  Procedure Laterality Date  . Neck surgery    . Cardiac catheterization  04/17/2007  . 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 Cholecystectomy  1996  . Colonoscopy  02/27/2008    COMPLETE  . Lumbar vertebral fusion  07/2008    Current Outpatient Prescriptions  Medication Sig Dispense Refill  . amLODipine (NORVASC) 10 MG tablet Take 1 tablet (10 mg total) by mouth daily.  30 tablet  0  . clonazePAM (KLONOPIN) 1 MG tablet Take 1 mg by mouth 2 (two) times daily as needed. For anxiety      . IRON PO Take by mouth.      . levETIRAcetam (KEPPRA) 1000 MG tablet Take 1,000 mg by mouth daily.       Marland Kitchen lisinopril (PRINIVIL,ZESTRIL) 20 MG tablet Take 20 mg by mouth daily.      . meclizine (ANTIVERT) 25 MG tablet Take 1 tablet (25 mg total) by mouth 3 (three) times daily as needed for dizziness.  30 tablet  0  . ondansetron (ZOFRAN) 4 MG tablet Take 1 tablet (4 mg total) by mouth every 8 (eight) hours as needed for nausea or vomiting.  20 tablet  0  . paroxetine mesylate (PEXEVA) 40 MG tablet Take 40 mg by mouth at bedtime.       Marland Kitchen spironolactone (ALDACTONE) 25 MG tablet Take 1 tablet (25 mg total) by mouth daily.  30 tablet  0  . TRIAMCINOLONE ACETONIDE, TOP, 0.05 % OINT Apply topically.      Marland Kitchen zolpidem (AMBIEN) 10 MG tablet Take 1 tablet (10 mg total) by mouth at bedtime as needed for sleep. scheduled  30 tablet  0  . aspirin 325 MG tablet Take 325 mg by mouth daily.       . hydrOXYzine (ATARAX/VISTARIL) 25 MG tablet Take 25 mg by mouth 2 (two) times daily as needed for itching.       No current facility-administered medications for this visit.    Allergies as of 11/16/2013 - Review Complete 11/16/2013  Allergen Reaction Noted  . Dilantin [phenytoin sodium extended]  01/03/2013  . Nitroglycerin Other (See  Comments) 11/16/2013  . Penicillins  01/03/2013  . Tramadol Other (See Comments) 11/16/2013    Vitals: BP 94/64  Pulse 84  Ht 6\' 2"  (1.88 m)  Wt 225 lb 9.6 oz (102.331 kg)  BMI 28.95 kg/m2 Last Weight:  Wt Readings from Last 1 Encounters:  11/16/13 225 lb 9.6 oz (102.331 kg)   Last Height:   Ht Readings from Last 1 Encounters:  11/16/13 6\' 2"  (1.88 m)    Physical exam: Exam: Gen: NAD, conversant, psychomotor slowing Eyes: anicteric sclerae, moist conjunctivae HENT: Atraumatic, oropharynx clear Neck: Trachea midline; supple,  Lungs: CTA, no wheezing, rales, rhonic                          CV: RRR, no MRG Abdomen: Soft, non-tender;  Extremities: No peripheral edema  Skin: Normal temperature, no rash,  Psych: odd affect, giggling, appeared impaired  Neuro: Detailed Neurologic Exam  Speech:    Speech is slowed but not dysarthric.The patient is oriented to person, place, and time;  Cranial Nerves:    The pupils are equal, round, and reactive to light. The fundi are normal and spontaneous venous pulsations are present. Visual fields are full to finger confrontation. Extraocular movements are intact. Trigeminal sensation is intact and the muscles of mastication are normal. Left facial droop and decreased nasolabial fold (previous bells palsy). The palate elevates in the midline. Voice is normal. Shoulder shrug is normal. The tongue has normal motion without fasciculations.   Coordination:    Normal finger to nose and heel to shin. Normal rapid alternating movements.   Gait:    Heel-toe and tandem gait are normal.   Motor  Observation:    No asymmetry, no atrophy, and no involuntary movements noted. Tone:    Normal muscle tone.    Posture:    Posture is normal. normal erect    Strength:    Strength is V/V in the upper and lower limbs.       Vibratory Sensation:    Normal vibratory sensation in upper and lower extremities.   Light Touch:    Normal light touch sensation in upper and lower extremities.     Proprioception:    Normal proprioception in upper and lower extremities.  Pin Prick:    Decreased distally in the lower extremities    Temperature:      Decreased distally in the lower extremities       Reflex Exam:  DTR's:    Deep tendon reflexes in the upper and lower extremities are normal bilaterally.   Toes:    The toes are downgoing bilaterally.   Clonus:    Clonus is absent.     Assessment:  53 year old who is here for evaluation of dizziness and vertigo for about a month. Triggers are movement, standing up.  Describes room spinning, + nausea, + vomiting. No hearing fullness or hearing loss. He has the episodes daily. Takes keppra for seizures. He had seizures as a teenager but last 2013 and is stable on Keppra. Smokes 1 pack daily. +HTN, + HLD, CAD. Neuro exam significant only for decrease pin prick and temperature in the lower extremities. Negative dix hallpike. Had an odd affect, with psychomotor slowing. Wife appeared to be unconcerned and laughing at his jokes so possibly that is his baseline. Denied drug use but need to explore that further.  Performed epley maneuver and gave to patient and wife with instructions on how to perform at  home. For vertigo will orde an MRI of the brain and MRA of the neck. Patient says they don't have insurance so probably won't get. I explained this could be symptoms of a stroke or vascular deficiency especially given his risk factors including smoking. Counseled for smoking cessation Will order lab tests for neuropathy testing.  Take  Meclizine and Zofran for vertigo and nausea. Discussed common side effects. Patient is not driving.  Follow up in 3 months. Call in the meantime if not better. If symptoms worsen, go to emergency room Seizures: stable on Keppra. Continue dose.  Drink fluids, stay hydrated. BP low today in clinic. Hypotension can cause dizzyness.        Sarina Ill, MD  [facility] [address] [place], [place] [address]  Phone [phone] Fax [phone]

## 2013-11-16 NOTE — Patient Instructions (Addendum)
Overall you are doing fairly well but I do want to suggest a few things today:   Remember to drink plenty of fluid, eat healthy meals and do not skip any meals. Try to eat protein with a every meal and eat a healthy snack such as fruit or nuts in between meals. Try to keep a regular sleep-wake schedule and try to exercise daily, particularly in the form of walking, 20-30 minutes a day, if you can.   As far as your medications are concerned, I would like to suggest Meclizine for vertigo and zofran for nausea. Common side effects include fatigue and dizziness.   As far as diagnostic testing: MRI of the brain and MRA of the vessels  I would like to see you back in 3 months, sooner if we need to. Please call us with any interim questions, concerns, problems, updates or refill requests.   Please also call us for any test results so we can go over those with you on the phone.  My clinical assistant and will answer any of your questions and relay your messages to me and also relay most of my messages to you.   Our phone number is [phone]. We also have an after hours call service for urgent matters and there is a physician on-call for urgent questions. For any emergencies you know to call 911 or go to the nearest emergency room

## 2013-11-20 LAB — COMPREHENSIVE METABOLIC PANEL
A/G RATIO: 1.1 (ref 1.1–2.5)
ALBUMIN: 4.4 g/dL (ref 3.5–5.5)
ALK PHOS: 84 IU/L (ref 39–117)
ALT: 17 IU/L (ref 0–44)
AST: 17 IU/L (ref 0–40)
BILIRUBIN TOTAL: 0.3 mg/dL (ref 0.0–1.2)
BUN / CREAT RATIO: 15 (ref 9–20)
BUN: 14 mg/dL (ref 6–24)
CO2: 28 mmol/L (ref 18–29)
CREATININE: 0.93 mg/dL (ref 0.76–1.27)
Calcium: 9.4 mg/dL (ref 8.7–10.2)
Chloride: 95 mmol/L — ABNORMAL LOW (ref 97–108)
GFR calc non Af Amer: 92 mL/min/{1.73_m2} (ref >59)
GFR, EST AFRICAN AMERICAN: 106 mL/min/{1.73_m2} (ref >59)
GLOBULIN, TOTAL: 3.9 g/dL (ref 1.5–4.5)
Glucose: 82 mg/dL (ref 65–99)
Potassium: 4.7 mmol/L (ref 3.5–5.2)
Sodium: 137 mmol/L (ref 134–144)
Total Protein: 8.3 g/dL (ref 6.0–8.5)

## 2013-11-20 LAB — IFE AND PE, SERUM
ALBUMIN SERPL ELPH-MCNC: 3.7 g/dL (ref 3.2–5.6)
ALPHA2 GLOB SERPL ELPH-MCNC: 1 g/dL (ref 0.4–1.2)
Albumin/Glob SerPl: 0.9 (ref 0.7–2.0)
Alpha 1: 0.3 g/dL (ref 0.1–0.4)
B-GLOBULIN SERPL ELPH-MCNC: 1.2 g/dL (ref 0.6–1.3)
GLOBULIN, TOTAL: 4.6 g/dL — AB (ref 2.0–4.5)
Gamma Glob SerPl Elph-Mcnc: 2.2 g/dL — ABNORMAL HIGH (ref 0.5–1.6)
IGM (IMMUNOGLOBULIN M), SRM: 100 mg/dL (ref 40–230)
IgA/Immunoglobulin A, Serum: 613 mg/dL — ABNORMAL HIGH (ref 91–414)
IgG (Immunoglobin G), Serum: 2217 mg/dL — ABNORMAL HIGH (ref 700–1600)

## 2013-11-20 LAB — HEAVY METALS, BLOOD
Arsenic: 8 ug/L (ref 2–23)
Lead, Blood: NOT DETECTED ug/dL (ref 0–19)
Mercury: 1.5 ug/L (ref 0.0–14.9)

## 2013-11-20 LAB — HEMOGLOBIN A1C
ESTIMATED AVERAGE GLUCOSE: 128 mg/dL
Hgb A1c MFr Bld: 6.1 % — ABNORMAL HIGH (ref 4.8–5.6)

## 2013-11-20 LAB — ANA W/REFLEX: Anti Nuclear Antibody(ANA): POSITIVE — AB

## 2013-11-20 LAB — ENA+DNA/DS+SJORGEN'S
ENA RNP AB: 1 AI — AB (ref 0.0–0.9)
ENA SM Ab Ser-aCnc: 0.2 AI (ref 0.0–0.9)
ENA SSA (RO) Ab: >8 AI — ABNORMAL HIGH (ref 0.0–0.9)
ENA SSB (LA) Ab: <0.2 AI (ref 0.0–0.9)
dsDNA Ab: 1 IU/mL (ref 0–9)

## 2013-11-20 LAB — VITAMIN B12: VITAMIN B 12: 495 pg/mL (ref 211–946)

## 2013-11-20 LAB — CRYOGLOBULIN, QL, SERUM, RFLX

## 2013-11-20 LAB — ANTIMYELOPEROXIDASE (MPO) ABS: Myeloperoxidase Ab: <9 U/mL (ref 0.0–9.0)

## 2013-11-20 LAB — SEDIMENTATION RATE: SED RATE: 22 mm/h (ref 0–30)

## 2013-11-20 LAB — HIV ANTIBODY (ROUTINE TESTING W REFLEX): HIV-1/HIV-2 Ab: NONREACTIVE

## 2013-11-20 LAB — ANTINEUTROPHIL CYTOPLASMIC AB: Atypical pANCA: <1:20 {titer}

## 2013-11-20 LAB — RPR: RPR: NONREACTIVE

## 2013-11-20 LAB — LYME, TOTAL AB TEST/REFLEX

## 2013-11-25 ENCOUNTER — Other Ambulatory Visit: Payer: Self-pay | Admitting: Neurology

## 2013-11-25 DIAGNOSIS — R899 Unspecified abnormal finding in specimens from other organs, systems and tissues: Secondary | ICD-10-CM

## 2013-11-25 DIAGNOSIS — G609 Hereditary and idiopathic neuropathy, unspecified: Secondary | ICD-10-CM

## 2013-12-01 ENCOUNTER — Telehealth: Payer: Self-pay | Admitting: Neurology

## 2013-12-01 NOTE — Telephone Encounter (Signed)
Frank Carey - Can you call him back and let him know that I will ask my office manager about the [facility] bill. I don't know anything about billing, I don't handle any billing whatsoever.  Also tell him that in order to evaluate his stroke, I need him to come into the office for an appointment. If they have any records, I will need them as there are different kinds of strokes and different causes for stroke and I need the information to help him.  If they don't have records, I will need them to fill out a request for medical records so that I can obtain the records before he is seen.   Thank you.

## 2013-12-01 NOTE — Telephone Encounter (Signed)
Patient's wife calling to state that they received the bill from [facility] for labwork and it was over $2,000 and they don't have insurance, wife is requesting advice and also wants to get a copy of the results since they have to pay so much for it. Also, patient's wife forgot to mention that back in December of 2013 they did tests on patient at [facility] and saw that he has had mini strokes. Please return call and advise.

## 2013-12-02 NOTE — Telephone Encounter (Signed)
Called patient and left a message that per billing they should call [facility] with the lab billing questions since they are separate. If the patient have any more questions they should call the office back so that someone can help them.

## 2013-12-02 NOTE — Telephone Encounter (Signed)
Angie or Lonn Georgia can you please help with this or let me know what I should tell patient or Dr. Jaynee Eagles. Thanks

## 2013-12-10 ENCOUNTER — Other Ambulatory Visit: Payer: Self-pay | Admitting: Neurology

## 2014-02-15 ENCOUNTER — Ambulatory Visit: Payer: Self-pay | Admitting: Neurology

## 2014-02-23 ENCOUNTER — Encounter: Payer: Self-pay | Admitting: Neurology

## 2014-08-09 ENCOUNTER — Emergency Department (HOSPITAL_BASED_OUTPATIENT_CLINIC_OR_DEPARTMENT_OTHER)
Admission: EM | Admit: 2014-08-09 | Discharge: 2014-08-09 | Disposition: A | Discharge status: Home / Self Care | Payer: Managed Care, Other (non HMO) | Attending: Emergency Medicine | Admitting: Emergency Medicine

## 2014-08-09 ENCOUNTER — Telehealth (HOSPITAL_COMMUNITY): Payer: Self-pay

## 2014-08-09 ENCOUNTER — Encounter (HOSPITAL_BASED_OUTPATIENT_CLINIC_OR_DEPARTMENT_OTHER): Payer: Self-pay | Admitting: *Deleted

## 2014-08-09 DIAGNOSIS — Z9889 Other specified postprocedural states: Secondary | ICD-10-CM | POA: Diagnosis not present

## 2014-08-09 DIAGNOSIS — Y998 Other external cause status: Secondary | ICD-10-CM | POA: Insufficient documentation

## 2014-08-09 DIAGNOSIS — I2510 Atherosclerotic heart disease of native coronary artery without angina pectoris: Secondary | ICD-10-CM | POA: Insufficient documentation

## 2014-08-09 DIAGNOSIS — W540XXA Bitten by dog, initial encounter: Secondary | ICD-10-CM | POA: Diagnosis not present

## 2014-08-09 DIAGNOSIS — Y9389 Activity, other specified: Secondary | ICD-10-CM | POA: Insufficient documentation

## 2014-08-09 DIAGNOSIS — Z872 Personal history of diseases of the skin and subcutaneous tissue: Secondary | ICD-10-CM | POA: Insufficient documentation

## 2014-08-09 DIAGNOSIS — Z8719 Personal history of other diseases of the digestive system: Secondary | ICD-10-CM | POA: Insufficient documentation

## 2014-08-09 DIAGNOSIS — Z23 Encounter for immunization: Secondary | ICD-10-CM | POA: Insufficient documentation

## 2014-08-09 DIAGNOSIS — Z8709 Personal history of other diseases of the respiratory system: Secondary | ICD-10-CM | POA: Insufficient documentation

## 2014-08-09 DIAGNOSIS — S61452A Open bite of left hand, initial encounter: Secondary | ICD-10-CM | POA: Diagnosis not present

## 2014-08-09 DIAGNOSIS — F329 Major depressive disorder, single episode, unspecified: Secondary | ICD-10-CM | POA: Insufficient documentation

## 2014-08-09 DIAGNOSIS — Z880 Allergy status to penicillin: Secondary | ICD-10-CM | POA: Diagnosis not present

## 2014-08-09 DIAGNOSIS — Y9289 Other specified places as the place of occurrence of the external cause: Secondary | ICD-10-CM | POA: Diagnosis not present

## 2014-08-09 DIAGNOSIS — G40909 Epilepsy, unspecified, not intractable, without status epilepticus: Secondary | ICD-10-CM | POA: Diagnosis not present

## 2014-08-09 DIAGNOSIS — Z7982 Long term (current) use of aspirin: Secondary | ICD-10-CM | POA: Insufficient documentation

## 2014-08-09 DIAGNOSIS — Z8739 Personal history of other diseases of the musculoskeletal system and connective tissue: Secondary | ICD-10-CM | POA: Diagnosis not present

## 2014-08-09 DIAGNOSIS — Z951 Presence of aortocoronary bypass graft: Secondary | ICD-10-CM | POA: Insufficient documentation

## 2014-08-09 DIAGNOSIS — Z720 Tobacco use: Secondary | ICD-10-CM | POA: Diagnosis not present

## 2014-08-09 DIAGNOSIS — I10 Essential (primary) hypertension: Secondary | ICD-10-CM | POA: Diagnosis not present

## 2014-08-09 DIAGNOSIS — Z79899 Other long term (current) drug therapy: Secondary | ICD-10-CM | POA: Diagnosis not present

## 2014-08-09 MED ORDER — MELOXICAM 15 MG PO TABS: 15.0000 mg | ORAL_TABLET | Freq: Every day | ORAL | Status: AC

## 2014-08-09 MED ORDER — CLINDAMYCIN HCL 300 MG PO CAPS: 300.0000 mg | ORAL_CAPSULE | Freq: Four times a day (QID) | ORAL | Status: AC

## 2014-08-09 MED ORDER — RABIES VACCINE, PCEC IM SUSR
1.0000 mL | Freq: Once | INTRAMUSCULAR | Status: AC
  Administered 2014-08-09: 1 mL via INTRAMUSCULAR
  Filled 2014-08-09: qty 1

## 2014-08-09 MED ORDER — CLINDAMYCIN HCL 150 MG PO CAPS
300.0000 mg | ORAL_CAPSULE | Freq: Once | ORAL | Status: AC
Start: 2014-08-09 — End: 2014-08-09
  Administered 2014-08-09: 300 mg via ORAL
  Filled 2014-08-09: qty 2

## 2014-08-09 MED ORDER — TETANUS-DIPHTH-ACELL PERTUSSIS 5-2.5-18.5 LF-MCG/0.5 IM SUSP
0.5000 mL | Freq: Once | INTRAMUSCULAR | Status: AC
  Administered 2014-08-09: 0.5 mL via INTRAMUSCULAR
  Filled 2014-08-09: qty 0.5

## 2014-08-09 MED ORDER — RABIES IMMUNE GLOBULIN 150 UNIT/ML IM INJ
20.0000 [IU]/kg | INJECTION | Freq: Once | INTRAMUSCULAR | Status: AC
  Administered 2014-08-09: 1950 [IU] via INTRAMUSCULAR
  Filled 2014-08-09: qty 14

## 2014-08-09 NOTE — ED Notes (Addendum)
Pt is here after he was bitten by a dog (in [employer] on 5/22 just after 5pm).  Pt has 3 bitemarks on his left hand.  Pt knows who the dog belongs to and the owner told pt that dog has had rabies shot.  Pt does not know how to reach dog owner by phone and only knows him by first name.  Pt has wounds on his hand, no bleeding at this time.

## 2014-08-09 NOTE — ED Provider Notes (Signed)
CSN: [id number]     Arrival date & time 08/09/14  0137 History   First MD Initiated Contact with Patient 08/09/14 0209     Chief Complaint  Patient presents with  . Animal Bite     (Consider location/radiation/quality/duration/timing/severity/associated sxs/prior Treatment) Patient is a 54 y.o. male presenting with animal bite. The history is provided by the patient.  Animal Bite Contact animal:  Dog Location:  Hand Hand injury location:  [place] of L hand Time since incident:  10 hours Pain details:    Quality:  Aching   Severity:  Mild   Timing:  Constant   Progression:  Unchanged Incident location:  Outside Provoked: unprovoked   Notifications:  None Animal's rabies vaccination status:  Unknown Animal in possession: no   Tetanus status:  Unknown Worsened by:  Nothing tried Ineffective treatments:  None tried Associated symptoms: no fever     Past Medical History  Diagnosis Date  . Seizure   . Anxiety   . Insomnia   . Hypertension   . Coronary artery disease   . Unspecified fall   . Alcohol abuse, continuous   . Allergic rhinitis, cause unspecified   . Other chest pain   . Bell's palsy   . Brachial neuritis or radiculitis NOS   . Dizziness and giddiness   . Unspecified epilepsy without mention of intractable epilepsy   . Enthesopathy of hip region   . Pain in limb   . Dyspepsia and other specified disorders of function of stomach   . Ganglion of tendon sheath   . Headache(784.0)   . Internal hemorrhoids without mention of complication   . Lumbago   . Major depressive disorder, recurrent episode, unspecified   . Nonallopathic lesion of sacral region, not elsewhere classified   . Pain in limb   . Tobacco use disorder   . Personal history of noncompliance with medical treatment, presenting hazards to health   . Psychosexual dysfunction with inhibited sexual excitement   . Sebaceous cyst   . Seborrheic dermatitis, unspecified   . Sialoadenitis   .  Spontaneous ecchymoses   . Thoracic or lumbosacral neuritis or radiculitis, unspecified   . Disturbance of skin sensation    Past Surgical History  Procedure Laterality Date  . Neck surgery    . Cardiac catheterization  04/17/2007  . 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 Cholecystectomy  1996  . Colonoscopy  02/27/2008    COMPLETE  . Lumbar vertebral fusion  07/2008   Family History  Problem Relation Age of Onset  . Asthma Mother   . Asthma Daughter   . Coronary artery disease Mother   . Coronary artery disease Father   . Epilepsy Sister   . Epilepsy Paternal Grandmother   . Diabetes Maternal Grandmother   . Hypertension    . Heart disease Father   . Stroke Paternal Grandmother   . Stroke      uncle  . Stroke Maternal Grandmother    History  Substance Use Topics  . Smoking status: Current Every Day Smoker -- 1.00 packs/day for 25 years    Types: Cigarettes  . Smokeless tobacco: Never Used  . Alcohol Use: 0.6 oz/week    1 Shots of liquor per week     Comment: weekly    Review of Systems  Constitutional: Negative for fever.  Skin: Positive for wound.  All other systems reviewed and are negative.     Allergies  Dilantin; Nitroglycerin; Penicillins; and Tramadol  Home Medications   Prior to Admission medications   Medication Sig Start Date End Date Taking? Authorizing Provider  amLODipine (NORVASC) 10 MG tablet Take 1 tablet (10 mg total) by mouth daily. 02/28/12   Bynum Bellows, MD  aspirin 325 MG tablet Take 325 mg by mouth daily.    Historical Provider, MD  clonazePAM (KLONOPIN) 1 MG tablet Take 1 mg by mouth 2 (two) times daily as needed. For anxiety    Historical Provider, MD  hydrOXYzine (ATARAX/VISTARIL) 25 MG tablet Take 25 mg by mouth 2 (two) times daily as needed for itching.    Historical Provider, MD  IRON PO Take by mouth.    Historical Provider, MD  levETIRAcetam (KEPPRA) 1000 MG tablet Take 1,000 mg by mouth daily.      Historical Provider, MD  lisinopril (PRINIVIL,ZESTRIL) 20 MG tablet Take 20 mg by mouth daily.    Historical Provider, MD  meclizine (ANTIVERT) 25 MG tablet TAKE 1 TABLET BY MOUTH 3 TIMES A DAY AS NEEDED FORDIZZINESS 12/11/13   Melvenia Beam, MD  ondansetron (ZOFRAN) 4 MG tablet TAKE 1 TABLET BY MOUTH EVERY 8 HOURS AS NEEDED FOR NAUSEA OR VOMITING 12/11/13   Melvenia Beam, MD  paroxetine mesylate (PEXEVA) 40 MG tablet Take 40 mg by mouth at bedtime.     Historical Provider, MD  spironolactone (ALDACTONE) 25 MG tablet Take 1 tablet (25 mg total) by mouth daily. 02/28/12   Bynum Bellows, MD  TRIAMCINOLONE ACETONIDE, TOP, 0.05 % OINT Apply topically.    Historical Provider, MD  zolpidem (AMBIEN) 10 MG tablet Take 1 tablet (10 mg total) by mouth at bedtime as needed for sleep. scheduled 02/28/12   Bynum Bellows, MD   BP 119/62 mmHg  Pulse 66  Temp(Src) 97.3 F (36.3 C) (Oral)  Resp 22  Ht 6\' 2"  (1.88 m)  Wt 217 lb (98.431 kg)  BMI 27.85 kg/m2  SpO2 100% Physical Exam  Constitutional: He is oriented to person, place, and time. He appears well-developed and well-nourished. No distress.  HENT:  Head: Normocephalic and atraumatic.  Eyes: Conjunctivae and EOM are normal.  Neck: Normal range of motion. Neck supple.  Cardiovascular: Normal rate, regular rhythm and intact distal pulses.   Pulmonary/Chest: Effort normal and breath sounds normal. No respiratory distress. He has no wheezes. He has no rales.  Abdominal: Soft. Bowel sounds are normal. There is no tenderness. There is no rebound and no guarding.  Musculoskeletal: Normal range of motion.       Left wrist: He exhibits normal range of motion, no tenderness, no bony tenderness, no swelling, no effusion, no crepitus, no deformity and no laceration.       Left hand: He exhibits normal range of motion, no tenderness, no bony tenderness, normal two-point discrimination, normal capillary refill, no deformity and no swelling. Normal sensation  noted. Normal strength noted.       Hands: Neurological: He is alert and oriented to person, place, and time. He has normal reflexes.  Skin: Skin is warm and dry.  Psychiatric: He has a normal mood and affect.    ED Course  Procedures (including critical care time) Labs Review Labs Reviewed - No data to display  Imaging Review No results found.   EKG Interpretation None      MDM   Final diagnoses:  None    Has no contact information for the owner of the pit bull involved in the attack.  HPPD in to see the patient as patient states he wanted the police called and "he wants the dog dead before he will take the rabies vaccine."  EDP explained that given we have no vaccination status on the dog in question and no way to contact the owner to see if the vaccinations are documented and up to date or any way to have the animal quarantined it is policy to start the vaccinations and the immunoglobulin.  Patient initially refusing and now has consented to treatment.  Will need to go home on clindamycin as he is PCN allergic.  Will need to follow up with hand surgery.  Schedule given for vaccinations for urgent care.   Patient verbalizes understanding of all instructions.     Veatrice Kells, MD 08/09/14 [phone]

## 2014-08-09 NOTE — Telephone Encounter (Signed)
called to find out results on the animal that bit him. referred to animal control

## 2014-08-09 NOTE — ED Notes (Signed)
Pt did not want to have his vital signs repeated at d/c home.

## 2014-08-09 NOTE — ED Notes (Signed)
Explained to pt the importance of follow up with [facility] for completion of rabies series.  Pt is refusing any further information or education on this matter but states that he will be following up with his PCP.  Pt did appear receptive to the idea of taking his antibiotics and voiced understanding of instructions to take these

## 2014-08-10 NOTE — ED Notes (Signed)
Pt called multiple times to find out the name of the physician he was referred to for his dog bite.  When returning the call, patient shouted that he was not sure he wanted to here what I had to say since "We" could not find him in our system.  Explained that I have a copy of the discharge paperwork and am calling to give him the information he has requested, or that if he does not need it that he can follow up with anyone.  Patient stated that I could just go and "piss on myself".

## 2014-08-26 DIAGNOSIS — F302 Manic episode, severe with psychotic symptoms: Secondary | ICD-10-CM | POA: Insufficient documentation

## 2014-08-26 DIAGNOSIS — F1920 Other psychoactive substance dependence, uncomplicated: Secondary | ICD-10-CM | POA: Insufficient documentation

## 2015-03-03 DIAGNOSIS — Z23 Encounter for immunization: Secondary | ICD-10-CM | POA: Insufficient documentation

## 2015-05-10 DIAGNOSIS — I724 Aneurysm of artery of lower extremity: Secondary | ICD-10-CM | POA: Insufficient documentation

## 2015-05-10 DIAGNOSIS — I70203 Unspecified atherosclerosis of native arteries of extremities, bilateral legs: Secondary | ICD-10-CM | POA: Insufficient documentation

## 2015-05-11 DIAGNOSIS — I10 Essential (primary) hypertension: Secondary | ICD-10-CM | POA: Insufficient documentation

## 2015-05-11 DIAGNOSIS — I25810 Atherosclerosis of coronary artery bypass graft(s) without angina pectoris: Secondary | ICD-10-CM | POA: Insufficient documentation

## 2015-05-11 DIAGNOSIS — E785 Hyperlipidemia, unspecified: Secondary | ICD-10-CM | POA: Insufficient documentation

## 2015-05-12 DIAGNOSIS — E785 Hyperlipidemia, unspecified: Secondary | ICD-10-CM | POA: Insufficient documentation

## 2015-05-16 DIAGNOSIS — M5136 Other intervertebral disc degeneration, lumbar region: Secondary | ICD-10-CM | POA: Insufficient documentation

## 2015-06-30 ENCOUNTER — Emergency Department (HOSPITAL_COMMUNITY)
Admission: EM | Admit: 2015-06-30 | Discharge: 2015-06-30 | Discharge status: Against Medical Advice | Payer: Managed Care, Other (non HMO)

## 2015-06-30 NOTE — ED Notes (Signed)
Pt states that he does not want to be wait or to be seen

## 2015-07-02 DIAGNOSIS — G44011 Episodic cluster headache, intractable: Secondary | ICD-10-CM | POA: Insufficient documentation

## 2015-07-19 DIAGNOSIS — R768 Other specified abnormal immunological findings in serum: Secondary | ICD-10-CM | POA: Insufficient documentation

## 2016-03-27 DIAGNOSIS — Z760 Encounter for issue of repeat prescription: Secondary | ICD-10-CM | POA: Diagnosis not present

## 2016-03-27 DIAGNOSIS — F332 Major depressive disorder, recurrent severe without psychotic features: Secondary | ICD-10-CM | POA: Diagnosis not present

## 2016-03-27 DIAGNOSIS — I10 Essential (primary) hypertension: Secondary | ICD-10-CM | POA: Diagnosis not present

## 2016-03-27 DIAGNOSIS — F250 Schizoaffective disorder, bipolar type: Secondary | ICD-10-CM | POA: Diagnosis not present

## 2016-03-27 DIAGNOSIS — Z23 Encounter for immunization: Secondary | ICD-10-CM | POA: Diagnosis not present

## 2016-03-27 DIAGNOSIS — E7800 Pure hypercholesterolemia, unspecified: Secondary | ICD-10-CM | POA: Diagnosis not present

## 2016-04-10 DIAGNOSIS — R569 Unspecified convulsions: Secondary | ICD-10-CM | POA: Diagnosis not present

## 2016-04-10 DIAGNOSIS — I739 Peripheral vascular disease, unspecified: Secondary | ICD-10-CM | POA: Diagnosis not present

## 2016-04-10 DIAGNOSIS — G894 Chronic pain syndrome: Secondary | ICD-10-CM | POA: Diagnosis not present

## 2016-05-02 DIAGNOSIS — S76211A Strain of adductor muscle, fascia and tendon of right thigh, initial encounter: Secondary | ICD-10-CM | POA: Diagnosis not present

## 2016-06-11 ENCOUNTER — Other Ambulatory Visit: Payer: Self-pay | Admitting: Vascular Surgery

## 2016-06-11 DIAGNOSIS — I739 Peripheral vascular disease, unspecified: Secondary | ICD-10-CM

## 2016-08-20 ENCOUNTER — Encounter: Payer: Self-pay | Admitting: Vascular Surgery

## 2016-08-23 ENCOUNTER — Encounter (HOSPITAL_COMMUNITY): Payer: PPO

## 2016-08-23 ENCOUNTER — Encounter: Payer: PPO | Admitting: Vascular Surgery

## 2016-08-23 ENCOUNTER — Ambulatory Visit (HOSPITAL_COMMUNITY): Payer: PPO

## 2016-09-04 DIAGNOSIS — F332 Major depressive disorder, recurrent severe without psychotic features: Secondary | ICD-10-CM | POA: Diagnosis not present

## 2016-09-04 DIAGNOSIS — Z23 Encounter for immunization: Secondary | ICD-10-CM | POA: Diagnosis not present

## 2016-09-04 DIAGNOSIS — F411 Generalized anxiety disorder: Secondary | ICD-10-CM | POA: Diagnosis not present

## 2016-09-26 DIAGNOSIS — M5416 Radiculopathy, lumbar region: Secondary | ICD-10-CM | POA: Diagnosis not present

## 2016-09-26 DIAGNOSIS — R413 Other amnesia: Secondary | ICD-10-CM | POA: Diagnosis not present

## 2016-09-26 DIAGNOSIS — G40909 Epilepsy, unspecified, not intractable, without status epilepticus: Secondary | ICD-10-CM | POA: Diagnosis not present

## 2016-09-26 DIAGNOSIS — G609 Hereditary and idiopathic neuropathy, unspecified: Secondary | ICD-10-CM | POA: Diagnosis not present

## 2016-12-06 ENCOUNTER — Encounter: Payer: PPO | Admitting: Vascular Surgery

## 2016-12-06 ENCOUNTER — Encounter (HOSPITAL_COMMUNITY): Payer: PPO

## 2016-12-08 DIAGNOSIS — E876 Hypokalemia: Secondary | ICD-10-CM | POA: Diagnosis not present

## 2016-12-08 DIAGNOSIS — F17200 Nicotine dependence, unspecified, uncomplicated: Secondary | ICD-10-CM | POA: Diagnosis not present

## 2016-12-08 DIAGNOSIS — I21A1 Myocardial infarction type 2: Secondary | ICD-10-CM | POA: Diagnosis not present

## 2016-12-08 DIAGNOSIS — I25810 Atherosclerosis of coronary artery bypass graft(s) without angina pectoris: Secondary | ICD-10-CM | POA: Diagnosis not present

## 2016-12-08 DIAGNOSIS — G40909 Epilepsy, unspecified, not intractable, without status epilepticus: Secondary | ICD-10-CM | POA: Diagnosis not present

## 2016-12-08 DIAGNOSIS — Z79899 Other long term (current) drug therapy: Secondary | ICD-10-CM | POA: Diagnosis not present

## 2016-12-08 DIAGNOSIS — R579 Shock, unspecified: Secondary | ICD-10-CM | POA: Diagnosis not present

## 2016-12-08 DIAGNOSIS — E784 Other hyperlipidemia: Secondary | ICD-10-CM | POA: Diagnosis not present

## 2016-12-08 DIAGNOSIS — G9341 Metabolic encephalopathy: Secondary | ICD-10-CM | POA: Diagnosis not present

## 2016-12-08 DIAGNOSIS — I221 Subsequent ST elevation (STEMI) myocardial infarction of inferior wall: Secondary | ICD-10-CM | POA: Diagnosis not present

## 2016-12-08 DIAGNOSIS — Z7982 Long term (current) use of aspirin: Secondary | ICD-10-CM | POA: Diagnosis not present

## 2016-12-08 DIAGNOSIS — D649 Anemia, unspecified: Secondary | ICD-10-CM | POA: Diagnosis not present

## 2016-12-08 DIAGNOSIS — N170 Acute kidney failure with tubular necrosis: Secondary | ICD-10-CM | POA: Diagnosis not present

## 2016-12-08 DIAGNOSIS — F250 Schizoaffective disorder, bipolar type: Secondary | ICD-10-CM | POA: Diagnosis not present

## 2016-12-08 DIAGNOSIS — N2889 Other specified disorders of kidney and ureter: Secondary | ICD-10-CM | POA: Diagnosis not present

## 2016-12-08 DIAGNOSIS — N179 Acute kidney failure, unspecified: Secondary | ICD-10-CM | POA: Diagnosis not present

## 2016-12-08 DIAGNOSIS — I081 Rheumatic disorders of both mitral and tricuspid valves: Secondary | ICD-10-CM | POA: Diagnosis not present

## 2016-12-08 DIAGNOSIS — R569 Unspecified convulsions: Secondary | ICD-10-CM | POA: Diagnosis not present

## 2016-12-08 DIAGNOSIS — I25708 Atherosclerosis of coronary artery bypass graft(s), unspecified, with other forms of angina pectoris: Secondary | ICD-10-CM | POA: Diagnosis not present

## 2016-12-08 DIAGNOSIS — E875 Hyperkalemia: Secondary | ICD-10-CM | POA: Diagnosis not present

## 2016-12-08 DIAGNOSIS — I2510 Atherosclerotic heart disease of native coronary artery without angina pectoris: Secondary | ICD-10-CM | POA: Diagnosis not present

## 2016-12-08 DIAGNOSIS — I10 Essential (primary) hypertension: Secondary | ICD-10-CM | POA: Diagnosis not present

## 2016-12-08 DIAGNOSIS — I213 ST elevation (STEMI) myocardial infarction of unspecified site: Secondary | ICD-10-CM | POA: Diagnosis not present

## 2016-12-08 DIAGNOSIS — I4510 Unspecified right bundle-branch block: Secondary | ICD-10-CM | POA: Diagnosis not present

## 2016-12-08 DIAGNOSIS — R748 Abnormal levels of other serum enzymes: Secondary | ICD-10-CM | POA: Diagnosis not present

## 2016-12-08 DIAGNOSIS — I25118 Atherosclerotic heart disease of native coronary artery with other forms of angina pectoris: Secondary | ICD-10-CM | POA: Diagnosis not present

## 2016-12-08 DIAGNOSIS — E872 Acidosis: Secondary | ICD-10-CM | POA: Diagnosis not present

## 2016-12-08 DIAGNOSIS — Z716 Tobacco abuse counseling: Secondary | ICD-10-CM | POA: Diagnosis not present

## 2016-12-08 DIAGNOSIS — I454 Nonspecific intraventricular block: Secondary | ICD-10-CM | POA: Diagnosis not present

## 2016-12-08 DIAGNOSIS — R0902 Hypoxemia: Secondary | ICD-10-CM | POA: Diagnosis not present

## 2016-12-08 DIAGNOSIS — J811 Chronic pulmonary edema: Secondary | ICD-10-CM | POA: Diagnosis not present

## 2016-12-08 DIAGNOSIS — G9340 Encephalopathy, unspecified: Secondary | ICD-10-CM | POA: Diagnosis not present

## 2016-12-08 DIAGNOSIS — Z951 Presence of aortocoronary bypass graft: Secondary | ICD-10-CM | POA: Diagnosis not present

## 2016-12-08 DIAGNOSIS — E785 Hyperlipidemia, unspecified: Secondary | ICD-10-CM | POA: Diagnosis not present

## 2016-12-08 DIAGNOSIS — R9431 Abnormal electrocardiogram [ECG] [EKG]: Secondary | ICD-10-CM | POA: Diagnosis not present

## 2016-12-08 DIAGNOSIS — R4182 Altered mental status, unspecified: Secondary | ICD-10-CM | POA: Diagnosis not present

## 2016-12-08 DIAGNOSIS — Z9181 History of falling: Secondary | ICD-10-CM | POA: Diagnosis not present

## 2016-12-08 DIAGNOSIS — I2119 ST elevation (STEMI) myocardial infarction involving other coronary artery of inferior wall: Secondary | ICD-10-CM | POA: Diagnosis not present

## 2016-12-08 DIAGNOSIS — J9691 Respiratory failure, unspecified with hypoxia: Secondary | ICD-10-CM | POA: Diagnosis not present

## 2016-12-08 DIAGNOSIS — I959 Hypotension, unspecified: Secondary | ICD-10-CM | POA: Diagnosis not present

## 2016-12-08 DIAGNOSIS — Z720 Tobacco use: Secondary | ICD-10-CM | POA: Insufficient documentation

## 2016-12-08 DIAGNOSIS — E860 Dehydration: Secondary | ICD-10-CM | POA: Diagnosis not present

## 2016-12-08 DIAGNOSIS — N19 Unspecified kidney failure: Secondary | ICD-10-CM | POA: Diagnosis not present

## 2016-12-09 DIAGNOSIS — N19 Unspecified kidney failure: Secondary | ICD-10-CM | POA: Diagnosis not present

## 2016-12-12 DIAGNOSIS — I959 Hypotension, unspecified: Secondary | ICD-10-CM | POA: Diagnosis not present

## 2016-12-12 DIAGNOSIS — J811 Chronic pulmonary edema: Secondary | ICD-10-CM | POA: Diagnosis not present

## 2016-12-12 DIAGNOSIS — D649 Anemia, unspecified: Secondary | ICD-10-CM | POA: Diagnosis not present

## 2016-12-12 DIAGNOSIS — I10 Essential (primary) hypertension: Secondary | ICD-10-CM | POA: Diagnosis not present

## 2016-12-12 DIAGNOSIS — N179 Acute kidney failure, unspecified: Secondary | ICD-10-CM | POA: Diagnosis not present

## 2016-12-19 DIAGNOSIS — I25810 Atherosclerosis of coronary artery bypass graft(s) without angina pectoris: Secondary | ICD-10-CM | POA: Diagnosis not present

## 2016-12-19 DIAGNOSIS — I10 Essential (primary) hypertension: Secondary | ICD-10-CM | POA: Diagnosis not present

## 2016-12-19 DIAGNOSIS — I2510 Atherosclerotic heart disease of native coronary artery without angina pectoris: Secondary | ICD-10-CM | POA: Diagnosis not present

## 2016-12-19 DIAGNOSIS — R0602 Shortness of breath: Secondary | ICD-10-CM | POA: Diagnosis not present

## 2016-12-20 DIAGNOSIS — I25810 Atherosclerosis of coronary artery bypass graft(s) without angina pectoris: Secondary | ICD-10-CM | POA: Diagnosis not present

## 2016-12-20 DIAGNOSIS — D649 Anemia, unspecified: Secondary | ICD-10-CM | POA: Insufficient documentation

## 2016-12-20 DIAGNOSIS — I10 Essential (primary) hypertension: Secondary | ICD-10-CM | POA: Diagnosis not present

## 2016-12-20 DIAGNOSIS — N179 Acute kidney failure, unspecified: Secondary | ICD-10-CM | POA: Diagnosis not present

## 2016-12-20 DIAGNOSIS — G40909 Epilepsy, unspecified, not intractable, without status epilepticus: Secondary | ICD-10-CM | POA: Diagnosis not present

## 2017-01-01 DIAGNOSIS — R413 Other amnesia: Secondary | ICD-10-CM | POA: Diagnosis not present

## 2017-01-01 DIAGNOSIS — I10 Essential (primary) hypertension: Secondary | ICD-10-CM | POA: Diagnosis not present

## 2017-01-01 DIAGNOSIS — E7800 Pure hypercholesterolemia, unspecified: Secondary | ICD-10-CM | POA: Diagnosis not present

## 2017-01-01 DIAGNOSIS — D649 Anemia, unspecified: Secondary | ICD-10-CM | POA: Diagnosis not present

## 2017-01-01 DIAGNOSIS — G40909 Epilepsy, unspecified, not intractable, without status epilepticus: Secondary | ICD-10-CM | POA: Diagnosis not present

## 2017-01-16 ENCOUNTER — Encounter (HOSPITAL_COMMUNITY): Payer: PPO

## 2017-01-16 ENCOUNTER — Encounter: Payer: PPO | Admitting: Vascular Surgery

## 2017-01-24 DIAGNOSIS — R809 Proteinuria, unspecified: Secondary | ICD-10-CM | POA: Diagnosis not present

## 2017-01-24 DIAGNOSIS — Z23 Encounter for immunization: Secondary | ICD-10-CM | POA: Diagnosis not present

## 2017-01-24 DIAGNOSIS — D649 Anemia, unspecified: Secondary | ICD-10-CM | POA: Diagnosis not present

## 2017-01-24 DIAGNOSIS — N179 Acute kidney failure, unspecified: Secondary | ICD-10-CM | POA: Diagnosis not present

## 2017-01-24 DIAGNOSIS — I10 Essential (primary) hypertension: Secondary | ICD-10-CM | POA: Diagnosis not present

## 2017-03-06 DIAGNOSIS — F32A Depression, unspecified: Secondary | ICD-10-CM | POA: Insufficient documentation

## 2017-03-06 DIAGNOSIS — Z79899 Other long term (current) drug therapy: Secondary | ICD-10-CM | POA: Diagnosis not present

## 2017-03-06 DIAGNOSIS — J45909 Unspecified asthma, uncomplicated: Secondary | ICD-10-CM | POA: Insufficient documentation

## 2017-03-06 DIAGNOSIS — F419 Anxiety disorder, unspecified: Secondary | ICD-10-CM | POA: Insufficient documentation

## 2017-03-06 DIAGNOSIS — F329 Major depressive disorder, single episode, unspecified: Secondary | ICD-10-CM | POA: Insufficient documentation

## 2017-03-06 DIAGNOSIS — M1990 Unspecified osteoarthritis, unspecified site: Secondary | ICD-10-CM | POA: Insufficient documentation

## 2017-03-06 DIAGNOSIS — F40240 Claustrophobia: Secondary | ICD-10-CM | POA: Insufficient documentation

## 2017-04-13 DIAGNOSIS — N179 Acute kidney failure, unspecified: Secondary | ICD-10-CM | POA: Diagnosis not present

## 2017-04-18 DIAGNOSIS — N179 Acute kidney failure, unspecified: Secondary | ICD-10-CM | POA: Diagnosis not present

## 2017-04-18 DIAGNOSIS — I10 Essential (primary) hypertension: Secondary | ICD-10-CM | POA: Diagnosis not present

## 2017-04-18 DIAGNOSIS — R809 Proteinuria, unspecified: Secondary | ICD-10-CM | POA: Diagnosis not present

## 2017-07-18 DIAGNOSIS — M546 Pain in thoracic spine: Secondary | ICD-10-CM | POA: Diagnosis not present

## 2017-07-20 DIAGNOSIS — I25810 Atherosclerosis of coronary artery bypass graft(s) without angina pectoris: Secondary | ICD-10-CM | POA: Diagnosis not present

## 2017-07-20 DIAGNOSIS — F339 Major depressive disorder, recurrent, unspecified: Secondary | ICD-10-CM | POA: Diagnosis not present

## 2017-07-20 DIAGNOSIS — Z9119 Patient's noncompliance with other medical treatment and regimen: Secondary | ICD-10-CM | POA: Diagnosis not present

## 2017-07-20 DIAGNOSIS — F3164 Bipolar disorder, current episode mixed, severe, with psychotic features: Secondary | ICD-10-CM | POA: Diagnosis not present

## 2017-07-20 DIAGNOSIS — I10 Essential (primary) hypertension: Secondary | ICD-10-CM | POA: Diagnosis not present

## 2017-07-20 DIAGNOSIS — F29 Unspecified psychosis not due to a substance or known physiological condition: Secondary | ICD-10-CM | POA: Diagnosis not present

## 2017-07-20 DIAGNOSIS — Z8249 Family history of ischemic heart disease and other diseases of the circulatory system: Secondary | ICD-10-CM | POA: Diagnosis not present

## 2017-07-20 DIAGNOSIS — F312 Bipolar disorder, current episode manic severe with psychotic features: Secondary | ICD-10-CM | POA: Diagnosis not present

## 2017-07-20 DIAGNOSIS — Z87891 Personal history of nicotine dependence: Secondary | ICD-10-CM | POA: Diagnosis not present

## 2017-07-20 DIAGNOSIS — E785 Hyperlipidemia, unspecified: Secondary | ICD-10-CM | POA: Diagnosis not present

## 2017-07-20 DIAGNOSIS — F1210 Cannabis abuse, uncomplicated: Secondary | ICD-10-CM | POA: Diagnosis not present

## 2017-07-20 DIAGNOSIS — F250 Schizoaffective disorder, bipolar type: Secondary | ICD-10-CM | POA: Diagnosis not present

## 2017-07-20 DIAGNOSIS — G40909 Epilepsy, unspecified, not intractable, without status epilepticus: Secondary | ICD-10-CM | POA: Diagnosis not present

## 2017-07-26 ENCOUNTER — Other Ambulatory Visit: Payer: Self-pay

## 2017-07-26 MED ORDER — PAROXETINE HCL 20 MG PO TABS
20.00 | ORAL_TABLET | ORAL | Status: DC
Start: 2017-07-25 — End: 2017-07-26

## 2017-07-26 MED ORDER — NICOTINE POLACRILEX 2 MG MT GUM
2.00 | CHEWING_GUM | OROMUCOSAL | Status: DC
Start: ? — End: 2017-07-26

## 2017-07-26 MED ORDER — ZIPRASIDONE MESYLATE 20 MG IM SOLR
20.00 | INTRAMUSCULAR | Status: DC
Start: ? — End: 2017-07-26

## 2017-07-26 MED ORDER — RISPERIDONE 3 MG PO TABS
3.00 | ORAL_TABLET | ORAL | Status: DC
Start: 2017-07-24 — End: 2017-07-26

## 2017-07-26 MED ORDER — BENZTROPINE MESYLATE 1 MG PO TABS
0.50 | ORAL_TABLET | ORAL | Status: DC
Start: 2017-07-24 — End: 2017-07-26

## 2017-07-26 MED ORDER — CLONAZEPAM 0.5 MG PO TABS
0.50 | ORAL_TABLET | ORAL | Status: DC
Start: 2017-07-24 — End: 2017-07-26

## 2017-07-26 MED ORDER — LORAZEPAM 2 MG/ML IJ SOLN
2.00 | INTRAMUSCULAR | Status: DC
Start: ? — End: 2017-07-26

## 2017-07-26 MED ORDER — BRIVARACETAM 50 MG PO TABS
1.00 | ORAL_TABLET | ORAL | Status: DC
Start: 2017-07-24 — End: 2017-07-26

## 2017-07-26 MED ORDER — GENERIC EXTERNAL MEDICATION
5.00 | Status: DC
Start: ? — End: 2017-07-26

## 2017-07-26 MED ORDER — GABAPENTIN 400 MG PO CAPS
400.00 | ORAL_CAPSULE | ORAL | Status: DC
Start: 2017-07-24 — End: 2017-07-26

## 2017-07-26 MED ORDER — CLOPIDOGREL BISULFATE 75 MG PO TABS
75.00 | ORAL_TABLET | ORAL | Status: DC
Start: 2017-07-25 — End: 2017-07-26

## 2017-07-26 MED ORDER — NICOTINE 14 MG/24HR TD PT24
1.00 | MEDICATED_PATCH | TRANSDERMAL | Status: DC
Start: 2017-07-25 — End: 2017-07-26

## 2017-07-26 MED ORDER — HALOPERIDOL 5 MG PO TABS
5.00 | ORAL_TABLET | ORAL | Status: DC
Start: ? — End: 2017-07-26

## 2017-07-26 MED ORDER — TRAZODONE HCL 100 MG PO TABS
100.00 | ORAL_TABLET | ORAL | Status: DC
Start: ? — End: 2017-07-26

## 2017-07-26 MED ORDER — LEVETIRACETAM 500 MG PO TABS
500.00 | ORAL_TABLET | ORAL | Status: DC
Start: 2017-07-24 — End: 2017-07-26

## 2017-07-26 MED ORDER — DIPHENHYDRAMINE HCL 25 MG PO CAPS
50.00 | ORAL_CAPSULE | ORAL | Status: DC
Start: ? — End: 2017-07-26

## 2017-07-26 MED ORDER — CARVEDILOL 3.125 MG PO TABS
3.13 | ORAL_TABLET | ORAL | Status: DC
Start: 2017-07-24 — End: 2017-07-26

## 2017-07-26 NOTE — Patient Outreach (Signed)
[facility]) Care Management  07/26/2017  Oaklee Sunga 03/05/1961 [id number]  Referral Date: 07/26/17 Referral Source:  HTA report Date of Admission: unknown Diagnosis: unknown Date of Discharge: 07/24/17 Facility: [place] [facility]   Referral received. No outreach warranted at this time. Transition of Care  will be completed by primary care provider office who will refer to [facility] care management if needed.  Plan: RN CM will close case at this time.  Jone Baseman, RN, MSN [facility] Care Management Care Management Coordinator Direct Line [phone] Toll Free: [phone]  Fax: [phone]

## 2017-07-31 ENCOUNTER — Encounter (HOSPITAL_COMMUNITY): Payer: Self-pay | Admitting: Psychiatry

## 2017-07-31 ENCOUNTER — Ambulatory Visit (INDEPENDENT_AMBULATORY_CARE_PROVIDER_SITE_OTHER): Payer: PPO | Admitting: Psychiatry

## 2017-07-31 VITALS — BP 132/90 | HR 79 | Ht 75.0 in | Wt 244.0 lb

## 2017-07-31 DIAGNOSIS — Z736 Limitation of activities due to disability: Secondary | ICD-10-CM

## 2017-07-31 DIAGNOSIS — F4489 Other dissociative and conversion disorders: Secondary | ICD-10-CM

## 2017-07-31 DIAGNOSIS — F419 Anxiety disorder, unspecified: Secondary | ICD-10-CM

## 2017-07-31 DIAGNOSIS — F3160 Bipolar disorder, current episode mixed, unspecified: Secondary | ICD-10-CM

## 2017-07-31 DIAGNOSIS — G40309 Generalized idiopathic epilepsy and epileptic syndromes, not intractable, without status epilepticus: Secondary | ICD-10-CM

## 2017-07-31 DIAGNOSIS — F1290 Cannabis use, unspecified, uncomplicated: Secondary | ICD-10-CM

## 2017-07-31 DIAGNOSIS — F17210 Nicotine dependence, cigarettes, uncomplicated: Secondary | ICD-10-CM

## 2017-07-31 MED ORDER — RISPERIDONE 4 MG PO TABS: 4.0000 mg | ORAL_TABLET | Freq: Every day | ORAL | 0 refills | Status: DC
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
## 2017-08-23 ENCOUNTER — Other Ambulatory Visit: Payer: Self-pay

## 2017-08-23 ENCOUNTER — Encounter (HOSPITAL_COMMUNITY): Payer: Self-pay | Admitting: Psychiatry

## 2017-08-23 ENCOUNTER — Ambulatory Visit (INDEPENDENT_AMBULATORY_CARE_PROVIDER_SITE_OTHER): Payer: PPO | Admitting: Psychiatry

## 2017-08-23 VITALS — BP 110/74 | HR 61 | Ht 75.0 in | Wt 247.0 lb

## 2017-08-23 DIAGNOSIS — F1290 Cannabis use, unspecified, uncomplicated: Secondary | ICD-10-CM

## 2017-08-23 DIAGNOSIS — F3160 Bipolar disorder, current episode mixed, unspecified: Secondary | ICD-10-CM | POA: Diagnosis not present

## 2017-08-23 DIAGNOSIS — G40309 Generalized idiopathic epilepsy and epileptic syndromes, not intractable, without status epilepticus: Secondary | ICD-10-CM

## 2017-08-23 DIAGNOSIS — F4489 Other dissociative and conversion disorders: Secondary | ICD-10-CM | POA: Diagnosis not present

## 2017-08-23 MED ORDER — RISPERIDONE 3 MG PO TABS: 3.0000 mg | ORAL_TABLET | Freq: Every day | ORAL | 1 refills | Status: DC
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
## 2017-08-24 ENCOUNTER — Other Ambulatory Visit (HOSPITAL_COMMUNITY): Payer: Self-pay | Admitting: Psychiatry

## 2017-08-26 DIAGNOSIS — I25810 Atherosclerosis of coronary artery bypass graft(s) without angina pectoris: Secondary | ICD-10-CM | POA: Diagnosis not present

## 2017-08-26 DIAGNOSIS — I252 Old myocardial infarction: Secondary | ICD-10-CM | POA: Diagnosis not present

## 2017-08-26 DIAGNOSIS — I70203 Unspecified atherosclerosis of native arteries of extremities, bilateral legs: Secondary | ICD-10-CM | POA: Diagnosis not present

## 2017-08-26 DIAGNOSIS — Z7982 Long term (current) use of aspirin: Secondary | ICD-10-CM | POA: Insufficient documentation

## 2017-08-26 DIAGNOSIS — I2510 Atherosclerotic heart disease of native coronary artery without angina pectoris: Secondary | ICD-10-CM | POA: Diagnosis not present

## 2017-08-30 DIAGNOSIS — I739 Peripheral vascular disease, unspecified: Secondary | ICD-10-CM | POA: Diagnosis not present

## 2017-08-30 DIAGNOSIS — R29898 Other symptoms and signs involving the musculoskeletal system: Secondary | ICD-10-CM | POA: Diagnosis not present

## 2017-08-30 DIAGNOSIS — I724 Aneurysm of artery of lower extremity: Secondary | ICD-10-CM | POA: Diagnosis not present

## 2017-08-30 DIAGNOSIS — I252 Old myocardial infarction: Secondary | ICD-10-CM | POA: Diagnosis not present

## 2017-09-22 ENCOUNTER — Other Ambulatory Visit (HOSPITAL_COMMUNITY): Payer: Self-pay | Admitting: Psychiatry

## 2017-10-24 ENCOUNTER — Ambulatory Visit (INDEPENDENT_AMBULATORY_CARE_PROVIDER_SITE_OTHER): Payer: PPO | Admitting: Psychiatry

## 2017-10-24 ENCOUNTER — Encounter (HOSPITAL_COMMUNITY): Payer: Self-pay | Admitting: Psychiatry

## 2017-10-24 DIAGNOSIS — F17210 Nicotine dependence, cigarettes, uncomplicated: Secondary | ICD-10-CM | POA: Diagnosis not present

## 2017-10-24 DIAGNOSIS — F3160 Bipolar disorder, current episode mixed, unspecified: Secondary | ICD-10-CM | POA: Diagnosis not present

## 2017-10-24 DIAGNOSIS — F1290 Cannabis use, unspecified, uncomplicated: Secondary | ICD-10-CM

## 2017-10-24 DIAGNOSIS — F4489 Other dissociative and conversion disorders: Secondary | ICD-10-CM | POA: Diagnosis not present

## 2017-10-24 MED ORDER — RISPERIDONE 3 MG PO TABS: 1.5000 mg | ORAL_TABLET | Freq: Every day | ORAL | 1 refills | Status: AC

## 2017-10-24 NOTE — Patient Instructions (Signed)
Lower down risperdal  follow up with primary care Avoid marijuana use
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
## 2017-12-18 DIAGNOSIS — Z125 Encounter for screening for malignant neoplasm of prostate: Secondary | ICD-10-CM | POA: Diagnosis not present

## 2017-12-18 DIAGNOSIS — F332 Major depressive disorder, recurrent severe without psychotic features: Secondary | ICD-10-CM | POA: Diagnosis not present

## 2017-12-18 DIAGNOSIS — I10 Essential (primary) hypertension: Secondary | ICD-10-CM | POA: Diagnosis not present

## 2017-12-18 DIAGNOSIS — Z9119 Patient's noncompliance with other medical treatment and regimen: Secondary | ICD-10-CM | POA: Diagnosis not present

## 2017-12-18 DIAGNOSIS — Z79899 Other long term (current) drug therapy: Secondary | ICD-10-CM | POA: Diagnosis not present

## 2017-12-18 DIAGNOSIS — F1910 Other psychoactive substance abuse, uncomplicated: Secondary | ICD-10-CM | POA: Diagnosis not present

## 2017-12-20 DIAGNOSIS — I10 Essential (primary) hypertension: Secondary | ICD-10-CM | POA: Diagnosis not present

## 2017-12-20 DIAGNOSIS — Z125 Encounter for screening for malignant neoplasm of prostate: Secondary | ICD-10-CM | POA: Diagnosis not present

## 2017-12-26 ENCOUNTER — Ambulatory Visit (HOSPITAL_COMMUNITY): Payer: Self-pay | Admitting: Psychiatry

## 2018-02-19 DIAGNOSIS — Z1211 Encounter for screening for malignant neoplasm of colon: Secondary | ICD-10-CM | POA: Diagnosis not present

## 2018-02-19 DIAGNOSIS — K921 Melena: Secondary | ICD-10-CM | POA: Diagnosis not present

## 2018-02-19 DIAGNOSIS — F319 Bipolar disorder, unspecified: Secondary | ICD-10-CM | POA: Diagnosis not present

## 2018-02-19 DIAGNOSIS — G4700 Insomnia, unspecified: Secondary | ICD-10-CM | POA: Diagnosis not present

## 2018-02-19 DIAGNOSIS — R825 Elevated urine levels of drugs, medicaments and biological substances: Secondary | ICD-10-CM | POA: Diagnosis not present

## 2018-02-19 DIAGNOSIS — Z23 Encounter for immunization: Secondary | ICD-10-CM | POA: Diagnosis not present

## 2018-02-19 DIAGNOSIS — Z9114 Patient's other noncompliance with medication regimen: Secondary | ICD-10-CM | POA: Diagnosis not present

## 2018-02-20 ENCOUNTER — Encounter: Payer: Self-pay | Admitting: *Deleted

## 2018-02-20 DIAGNOSIS — I739 Peripheral vascular disease, unspecified: Secondary | ICD-10-CM | POA: Diagnosis not present

## 2018-02-20 DIAGNOSIS — G40909 Epilepsy, unspecified, not intractable, without status epilepticus: Secondary | ICD-10-CM | POA: Diagnosis not present

## 2018-02-20 DIAGNOSIS — I724 Aneurysm of artery of lower extremity: Secondary | ICD-10-CM | POA: Diagnosis not present

## 2018-02-20 DIAGNOSIS — R29898 Other symptoms and signs involving the musculoskeletal system: Secondary | ICD-10-CM | POA: Diagnosis not present

## 2018-02-27 ENCOUNTER — Encounter: Payer: Self-pay | Admitting: *Deleted

## 2018-02-27 DIAGNOSIS — R200 Anesthesia of skin: Secondary | ICD-10-CM | POA: Insufficient documentation

## 2018-02-27 DIAGNOSIS — R208 Other disturbances of skin sensation: Secondary | ICD-10-CM | POA: Insufficient documentation

## 2018-02-27 DIAGNOSIS — G510 Bell's palsy: Secondary | ICD-10-CM | POA: Insufficient documentation

## 2018-02-27 DIAGNOSIS — R700 Elevated erythrocyte sedimentation rate: Secondary | ICD-10-CM | POA: Insufficient documentation

## 2018-02-27 DIAGNOSIS — F339 Major depressive disorder, recurrent, unspecified: Secondary | ICD-10-CM | POA: Insufficient documentation

## 2018-02-27 DIAGNOSIS — R413 Other amnesia: Secondary | ICD-10-CM | POA: Insufficient documentation

## 2018-02-27 DIAGNOSIS — R079 Chest pain, unspecified: Secondary | ICD-10-CM | POA: Insufficient documentation

## 2018-02-27 DIAGNOSIS — S8492XA Injury of unspecified nerve at lower leg level, left leg, initial encounter: Secondary | ICD-10-CM | POA: Insufficient documentation

## 2018-02-27 DIAGNOSIS — L03032 Cellulitis of left toe: Secondary | ICD-10-CM | POA: Insufficient documentation

## 2018-03-03 DIAGNOSIS — I639 Cerebral infarction, unspecified: Secondary | ICD-10-CM | POA: Diagnosis not present

## 2018-03-03 DIAGNOSIS — I2510 Atherosclerotic heart disease of native coronary artery without angina pectoris: Secondary | ICD-10-CM | POA: Diagnosis not present

## 2018-03-03 DIAGNOSIS — N179 Acute kidney failure, unspecified: Secondary | ICD-10-CM | POA: Diagnosis not present

## 2018-03-03 DIAGNOSIS — I739 Peripheral vascular disease, unspecified: Secondary | ICD-10-CM | POA: Diagnosis not present

## 2018-03-03 DIAGNOSIS — I252 Old myocardial infarction: Secondary | ICD-10-CM | POA: Diagnosis not present

## 2018-03-03 DIAGNOSIS — I724 Aneurysm of artery of lower extremity: Secondary | ICD-10-CM | POA: Diagnosis not present

## 2018-03-03 DIAGNOSIS — R0602 Shortness of breath: Secondary | ICD-10-CM | POA: Diagnosis not present

## 2018-03-03 DIAGNOSIS — I10 Essential (primary) hypertension: Secondary | ICD-10-CM | POA: Diagnosis not present

## 2018-03-03 DIAGNOSIS — I70203 Unspecified atherosclerosis of native arteries of extremities, bilateral legs: Secondary | ICD-10-CM | POA: Diagnosis not present

## 2018-03-17 DIAGNOSIS — R0989 Other specified symptoms and signs involving the circulatory and respiratory systems: Secondary | ICD-10-CM | POA: Diagnosis not present

## 2018-03-17 DIAGNOSIS — I70203 Unspecified atherosclerosis of native arteries of extremities, bilateral legs: Secondary | ICD-10-CM | POA: Diagnosis not present

## 2018-03-17 DIAGNOSIS — I739 Peripheral vascular disease, unspecified: Secondary | ICD-10-CM | POA: Diagnosis not present

## 2018-03-23 DIAGNOSIS — I739 Peripheral vascular disease, unspecified: Secondary | ICD-10-CM | POA: Diagnosis not present

## 2018-03-23 DIAGNOSIS — R29898 Other symptoms and signs involving the musculoskeletal system: Secondary | ICD-10-CM | POA: Diagnosis not present

## 2018-03-23 DIAGNOSIS — I724 Aneurysm of artery of lower extremity: Secondary | ICD-10-CM | POA: Diagnosis not present

## 2018-03-23 DIAGNOSIS — G40909 Epilepsy, unspecified, not intractable, without status epilepticus: Secondary | ICD-10-CM | POA: Diagnosis not present

## 2018-03-24 DIAGNOSIS — Z23 Encounter for immunization: Secondary | ICD-10-CM | POA: Diagnosis not present

## 2018-03-24 DIAGNOSIS — R05 Cough: Secondary | ICD-10-CM | POA: Diagnosis not present

## 2018-03-24 DIAGNOSIS — G40909 Epilepsy, unspecified, not intractable, without status epilepticus: Secondary | ICD-10-CM | POA: Diagnosis not present

## 2018-03-24 DIAGNOSIS — J069 Acute upper respiratory infection, unspecified: Secondary | ICD-10-CM | POA: Diagnosis not present

## 2018-03-24 DIAGNOSIS — I779 Disorder of arteries and arterioles, unspecified: Secondary | ICD-10-CM | POA: Diagnosis not present

## 2018-03-31 DIAGNOSIS — I70203 Unspecified atherosclerosis of native arteries of extremities, bilateral legs: Secondary | ICD-10-CM | POA: Diagnosis not present

## 2018-03-31 DIAGNOSIS — I724 Aneurysm of artery of lower extremity: Secondary | ICD-10-CM | POA: Diagnosis not present

## 2018-04-04 DIAGNOSIS — I6783 Posterior reversible encephalopathy syndrome: Secondary | ICD-10-CM | POA: Diagnosis not present

## 2018-04-04 DIAGNOSIS — G40909 Epilepsy, unspecified, not intractable, without status epilepticus: Secondary | ICD-10-CM | POA: Diagnosis not present

## 2018-04-04 DIAGNOSIS — I639 Cerebral infarction, unspecified: Secondary | ICD-10-CM | POA: Diagnosis not present

## 2018-04-04 DIAGNOSIS — M47819 Spondylosis without myelopathy or radiculopathy, site unspecified: Secondary | ICD-10-CM | POA: Diagnosis not present

## 2018-04-04 DIAGNOSIS — G609 Hereditary and idiopathic neuropathy, unspecified: Secondary | ICD-10-CM | POA: Diagnosis not present

## 2018-04-04 DIAGNOSIS — R825 Elevated urine levels of drugs, medicaments and biological substances: Secondary | ICD-10-CM | POA: Diagnosis not present

## 2018-04-23 DIAGNOSIS — I739 Peripheral vascular disease, unspecified: Secondary | ICD-10-CM | POA: Diagnosis not present

## 2018-04-23 DIAGNOSIS — I724 Aneurysm of artery of lower extremity: Secondary | ICD-10-CM | POA: Diagnosis not present

## 2018-04-23 DIAGNOSIS — G40909 Epilepsy, unspecified, not intractable, without status epilepticus: Secondary | ICD-10-CM | POA: Diagnosis not present

## 2018-04-23 DIAGNOSIS — R29898 Other symptoms and signs involving the musculoskeletal system: Secondary | ICD-10-CM | POA: Diagnosis not present

## 2018-05-22 DIAGNOSIS — G40909 Epilepsy, unspecified, not intractable, without status epilepticus: Secondary | ICD-10-CM | POA: Diagnosis not present

## 2018-05-22 DIAGNOSIS — I739 Peripheral vascular disease, unspecified: Secondary | ICD-10-CM | POA: Diagnosis not present

## 2018-05-22 DIAGNOSIS — I724 Aneurysm of artery of lower extremity: Secondary | ICD-10-CM | POA: Diagnosis not present

## 2018-05-22 DIAGNOSIS — R29898 Other symptoms and signs involving the musculoskeletal system: Secondary | ICD-10-CM | POA: Diagnosis not present

## 2018-06-22 DIAGNOSIS — G40909 Epilepsy, unspecified, not intractable, without status epilepticus: Secondary | ICD-10-CM | POA: Diagnosis not present

## 2018-06-22 DIAGNOSIS — I724 Aneurysm of artery of lower extremity: Secondary | ICD-10-CM | POA: Diagnosis not present

## 2018-06-22 DIAGNOSIS — R29898 Other symptoms and signs involving the musculoskeletal system: Secondary | ICD-10-CM | POA: Diagnosis not present

## 2018-06-22 DIAGNOSIS — I739 Peripheral vascular disease, unspecified: Secondary | ICD-10-CM | POA: Diagnosis not present

## 2018-07-19 DIAGNOSIS — I2510 Atherosclerotic heart disease of native coronary artery without angina pectoris: Secondary | ICD-10-CM | POA: Diagnosis not present

## 2018-07-19 DIAGNOSIS — N179 Acute kidney failure, unspecified: Secondary | ICD-10-CM | POA: Diagnosis not present

## 2018-07-19 DIAGNOSIS — N183 Chronic kidney disease, stage 3 (moderate): Secondary | ICD-10-CM | POA: Diagnosis not present

## 2018-07-19 DIAGNOSIS — R112 Nausea with vomiting, unspecified: Secondary | ICD-10-CM | POA: Diagnosis not present

## 2018-07-19 DIAGNOSIS — G9349 Other encephalopathy: Secondary | ICD-10-CM | POA: Diagnosis not present

## 2018-07-19 DIAGNOSIS — R1110 Vomiting, unspecified: Secondary | ICD-10-CM | POA: Diagnosis not present

## 2018-07-19 DIAGNOSIS — I951 Orthostatic hypotension: Secondary | ICD-10-CM | POA: Diagnosis not present

## 2018-07-19 DIAGNOSIS — R42 Dizziness and giddiness: Secondary | ICD-10-CM | POA: Diagnosis not present

## 2018-07-19 DIAGNOSIS — E1122 Type 2 diabetes mellitus with diabetic chronic kidney disease: Secondary | ICD-10-CM | POA: Diagnosis not present

## 2018-07-19 DIAGNOSIS — I69398 Other sequelae of cerebral infarction: Secondary | ICD-10-CM | POA: Diagnosis not present

## 2018-07-19 DIAGNOSIS — F250 Schizoaffective disorder, bipolar type: Secondary | ICD-10-CM | POA: Diagnosis not present

## 2018-07-19 DIAGNOSIS — G510 Bell's palsy: Secondary | ICD-10-CM | POA: Diagnosis not present

## 2018-07-19 DIAGNOSIS — N178 Other acute kidney failure: Secondary | ICD-10-CM | POA: Diagnosis not present

## 2018-07-19 DIAGNOSIS — R471 Dysarthria and anarthria: Secondary | ICD-10-CM | POA: Diagnosis not present

## 2018-07-19 DIAGNOSIS — I25810 Atherosclerosis of coronary artery bypass graft(s) without angina pectoris: Secondary | ICD-10-CM | POA: Diagnosis not present

## 2018-07-19 DIAGNOSIS — I714 Abdominal aortic aneurysm, without rupture: Secondary | ICD-10-CM | POA: Diagnosis not present

## 2018-07-19 DIAGNOSIS — I130 Hypertensive heart and chronic kidney disease with heart failure and stage 1 through stage 4 chronic kidney disease, or unspecified chronic kidney disease: Secondary | ICD-10-CM | POA: Diagnosis not present

## 2018-07-19 DIAGNOSIS — I674 Hypertensive encephalopathy: Secondary | ICD-10-CM | POA: Diagnosis not present

## 2018-07-19 DIAGNOSIS — E876 Hypokalemia: Secondary | ICD-10-CM | POA: Diagnosis not present

## 2018-07-19 DIAGNOSIS — F1010 Alcohol abuse, uncomplicated: Secondary | ICD-10-CM | POA: Diagnosis not present

## 2018-07-19 DIAGNOSIS — Z791 Long term (current) use of non-steroidal anti-inflammatories (NSAID): Secondary | ICD-10-CM | POA: Diagnosis not present

## 2018-07-19 DIAGNOSIS — R29898 Other symptoms and signs involving the musculoskeletal system: Secondary | ICD-10-CM | POA: Diagnosis not present

## 2018-07-19 DIAGNOSIS — E869 Volume depletion, unspecified: Secondary | ICD-10-CM | POA: Diagnosis not present

## 2018-07-19 DIAGNOSIS — E860 Dehydration: Secondary | ICD-10-CM | POA: Diagnosis not present

## 2018-07-19 DIAGNOSIS — Z7902 Long term (current) use of antithrombotics/antiplatelets: Secondary | ICD-10-CM | POA: Diagnosis not present

## 2018-07-19 DIAGNOSIS — G609 Hereditary and idiopathic neuropathy, unspecified: Secondary | ICD-10-CM | POA: Diagnosis not present

## 2018-07-19 DIAGNOSIS — I6783 Posterior reversible encephalopathy syndrome: Secondary | ICD-10-CM | POA: Diagnosis not present

## 2018-07-19 DIAGNOSIS — I5022 Chronic systolic (congestive) heart failure: Secondary | ICD-10-CM | POA: Diagnosis not present

## 2018-07-19 DIAGNOSIS — F259 Schizoaffective disorder, unspecified: Secondary | ICD-10-CM | POA: Diagnosis not present

## 2018-07-19 DIAGNOSIS — Z8673 Personal history of transient ischemic attack (TIA), and cerebral infarction without residual deficits: Secondary | ICD-10-CM | POA: Diagnosis not present

## 2018-07-19 DIAGNOSIS — I129 Hypertensive chronic kidney disease with stage 1 through stage 4 chronic kidney disease, or unspecified chronic kidney disease: Secondary | ICD-10-CM | POA: Diagnosis not present

## 2018-07-19 DIAGNOSIS — M6282 Rhabdomyolysis: Secondary | ICD-10-CM | POA: Diagnosis not present

## 2018-07-19 DIAGNOSIS — I724 Aneurysm of artery of lower extremity: Secondary | ICD-10-CM | POA: Diagnosis not present

## 2018-07-19 DIAGNOSIS — Z79899 Other long term (current) drug therapy: Secondary | ICD-10-CM | POA: Diagnosis not present

## 2018-07-19 DIAGNOSIS — E119 Type 2 diabetes mellitus without complications: Secondary | ICD-10-CM | POA: Diagnosis not present

## 2018-07-19 DIAGNOSIS — R34 Anuria and oliguria: Secondary | ICD-10-CM | POA: Diagnosis not present

## 2018-07-19 DIAGNOSIS — R1011 Right upper quadrant pain: Secondary | ICD-10-CM | POA: Diagnosis not present

## 2018-07-19 DIAGNOSIS — G40909 Epilepsy, unspecified, not intractable, without status epilepticus: Secondary | ICD-10-CM | POA: Diagnosis not present

## 2018-07-19 DIAGNOSIS — I110 Hypertensive heart disease with heart failure: Secondary | ICD-10-CM | POA: Diagnosis not present

## 2018-07-19 DIAGNOSIS — N170 Acute kidney failure with tubular necrosis: Secondary | ICD-10-CM | POA: Diagnosis not present

## 2018-07-19 DIAGNOSIS — D631 Anemia in chronic kidney disease: Secondary | ICD-10-CM | POA: Diagnosis not present

## 2018-07-19 DIAGNOSIS — E785 Hyperlipidemia, unspecified: Secondary | ICD-10-CM | POA: Diagnosis not present

## 2018-07-19 DIAGNOSIS — R945 Abnormal results of liver function studies: Secondary | ICD-10-CM | POA: Diagnosis not present

## 2018-07-19 DIAGNOSIS — R1310 Dysphagia, unspecified: Secondary | ICD-10-CM | POA: Diagnosis not present

## 2018-07-19 DIAGNOSIS — N189 Chronic kidney disease, unspecified: Secondary | ICD-10-CM | POA: Diagnosis not present

## 2018-07-19 DIAGNOSIS — I739 Peripheral vascular disease, unspecified: Secondary | ICD-10-CM | POA: Diagnosis not present

## 2018-07-19 DIAGNOSIS — F302 Manic episode, severe with psychotic symptoms: Secondary | ICD-10-CM | POA: Diagnosis not present

## 2018-07-19 DIAGNOSIS — F17210 Nicotine dependence, cigarettes, uncomplicated: Secondary | ICD-10-CM | POA: Diagnosis not present

## 2018-07-24 DIAGNOSIS — J45909 Unspecified asthma, uncomplicated: Secondary | ICD-10-CM | POA: Diagnosis not present

## 2018-07-24 DIAGNOSIS — E7800 Pure hypercholesterolemia, unspecified: Secondary | ICD-10-CM | POA: Diagnosis not present

## 2018-07-24 DIAGNOSIS — Z79899 Other long term (current) drug therapy: Secondary | ICD-10-CM | POA: Diagnosis not present

## 2018-07-24 DIAGNOSIS — I10 Essential (primary) hypertension: Secondary | ICD-10-CM | POA: Diagnosis not present

## 2018-07-24 DIAGNOSIS — G40909 Epilepsy, unspecified, not intractable, without status epilepticus: Secondary | ICD-10-CM | POA: Diagnosis not present

## 2018-07-25 DIAGNOSIS — I10 Essential (primary) hypertension: Secondary | ICD-10-CM | POA: Diagnosis not present

## 2018-07-25 DIAGNOSIS — R809 Proteinuria, unspecified: Secondary | ICD-10-CM | POA: Diagnosis not present

## 2018-07-25 DIAGNOSIS — N390 Urinary tract infection, site not specified: Secondary | ICD-10-CM | POA: Diagnosis not present

## 2018-07-25 DIAGNOSIS — N183 Chronic kidney disease, stage 3 (moderate): Secondary | ICD-10-CM | POA: Diagnosis not present

## 2018-07-28 DIAGNOSIS — R809 Proteinuria, unspecified: Secondary | ICD-10-CM | POA: Diagnosis not present

## 2018-07-28 DIAGNOSIS — N182 Chronic kidney disease, stage 2 (mild): Secondary | ICD-10-CM | POA: Diagnosis not present

## 2018-07-31 DIAGNOSIS — N183 Chronic kidney disease, stage 3 (moderate): Secondary | ICD-10-CM | POA: Diagnosis not present

## 2018-08-02 DIAGNOSIS — F17210 Nicotine dependence, cigarettes, uncomplicated: Secondary | ICD-10-CM | POA: Diagnosis not present

## 2018-08-02 DIAGNOSIS — L02214 Cutaneous abscess of groin: Secondary | ICD-10-CM | POA: Diagnosis not present

## 2018-08-02 DIAGNOSIS — I10 Essential (primary) hypertension: Secondary | ICD-10-CM | POA: Diagnosis not present

## 2018-08-02 DIAGNOSIS — L03314 Cellulitis of groin: Secondary | ICD-10-CM | POA: Diagnosis not present

## 2018-08-02 DIAGNOSIS — E7800 Pure hypercholesterolemia, unspecified: Secondary | ICD-10-CM | POA: Diagnosis not present

## 2018-08-08 DIAGNOSIS — N390 Urinary tract infection, site not specified: Secondary | ICD-10-CM | POA: Diagnosis not present

## 2018-08-08 DIAGNOSIS — I10 Essential (primary) hypertension: Secondary | ICD-10-CM | POA: Diagnosis not present

## 2018-08-08 DIAGNOSIS — N183 Chronic kidney disease, stage 3 (moderate): Secondary | ICD-10-CM | POA: Diagnosis not present

## 2018-08-20 DIAGNOSIS — N183 Chronic kidney disease, stage 3 (moderate): Secondary | ICD-10-CM | POA: Diagnosis not present

## 2018-08-20 DIAGNOSIS — I10 Essential (primary) hypertension: Secondary | ICD-10-CM | POA: Diagnosis not present

## 2018-08-22 DIAGNOSIS — N183 Chronic kidney disease, stage 3 (moderate): Secondary | ICD-10-CM | POA: Diagnosis not present

## 2018-08-22 DIAGNOSIS — I724 Aneurysm of artery of lower extremity: Secondary | ICD-10-CM | POA: Diagnosis not present

## 2018-08-22 DIAGNOSIS — G40909 Epilepsy, unspecified, not intractable, without status epilepticus: Secondary | ICD-10-CM | POA: Diagnosis not present

## 2018-08-22 DIAGNOSIS — R29898 Other symptoms and signs involving the musculoskeletal system: Secondary | ICD-10-CM | POA: Diagnosis not present

## 2018-08-22 DIAGNOSIS — I739 Peripheral vascular disease, unspecified: Secondary | ICD-10-CM | POA: Diagnosis not present

## 2018-08-22 DIAGNOSIS — N390 Urinary tract infection, site not specified: Secondary | ICD-10-CM | POA: Diagnosis not present

## 2018-08-22 DIAGNOSIS — I10 Essential (primary) hypertension: Secondary | ICD-10-CM | POA: Diagnosis not present

## 2018-08-29 DIAGNOSIS — G4700 Insomnia, unspecified: Secondary | ICD-10-CM | POA: Diagnosis not present

## 2018-08-29 DIAGNOSIS — E7800 Pure hypercholesterolemia, unspecified: Secondary | ICD-10-CM | POA: Diagnosis not present

## 2018-08-29 DIAGNOSIS — I10 Essential (primary) hypertension: Secondary | ICD-10-CM | POA: Diagnosis not present

## 2018-09-21 DIAGNOSIS — I724 Aneurysm of artery of lower extremity: Secondary | ICD-10-CM | POA: Diagnosis not present

## 2018-09-21 DIAGNOSIS — G40909 Epilepsy, unspecified, not intractable, without status epilepticus: Secondary | ICD-10-CM | POA: Diagnosis not present

## 2018-09-21 DIAGNOSIS — I739 Peripheral vascular disease, unspecified: Secondary | ICD-10-CM | POA: Diagnosis not present

## 2018-09-21 DIAGNOSIS — R29898 Other symptoms and signs involving the musculoskeletal system: Secondary | ICD-10-CM | POA: Diagnosis not present

## 2018-10-01 DIAGNOSIS — N183 Chronic kidney disease, stage 3 (moderate): Secondary | ICD-10-CM | POA: Diagnosis not present

## 2018-10-01 DIAGNOSIS — R809 Proteinuria, unspecified: Secondary | ICD-10-CM | POA: Diagnosis not present

## 2018-10-01 DIAGNOSIS — I10 Essential (primary) hypertension: Secondary | ICD-10-CM | POA: Diagnosis not present

## 2018-10-03 DIAGNOSIS — N390 Urinary tract infection, site not specified: Secondary | ICD-10-CM | POA: Diagnosis not present

## 2018-10-03 DIAGNOSIS — R809 Proteinuria, unspecified: Secondary | ICD-10-CM | POA: Diagnosis not present

## 2018-10-03 DIAGNOSIS — I10 Essential (primary) hypertension: Secondary | ICD-10-CM | POA: Diagnosis not present

## 2018-10-03 DIAGNOSIS — N183 Chronic kidney disease, stage 3 (moderate): Secondary | ICD-10-CM | POA: Diagnosis not present

## 2018-10-22 DIAGNOSIS — R29898 Other symptoms and signs involving the musculoskeletal system: Secondary | ICD-10-CM | POA: Diagnosis not present

## 2018-10-22 DIAGNOSIS — I724 Aneurysm of artery of lower extremity: Secondary | ICD-10-CM | POA: Diagnosis not present

## 2018-10-22 DIAGNOSIS — I739 Peripheral vascular disease, unspecified: Secondary | ICD-10-CM | POA: Diagnosis not present

## 2018-10-22 DIAGNOSIS — G40909 Epilepsy, unspecified, not intractable, without status epilepticus: Secondary | ICD-10-CM | POA: Diagnosis not present

## 2018-11-14 DIAGNOSIS — E7800 Pure hypercholesterolemia, unspecified: Secondary | ICD-10-CM | POA: Diagnosis not present

## 2018-11-14 DIAGNOSIS — J45909 Unspecified asthma, uncomplicated: Secondary | ICD-10-CM | POA: Diagnosis not present

## 2018-11-14 DIAGNOSIS — I10 Essential (primary) hypertension: Secondary | ICD-10-CM | POA: Diagnosis not present

## 2018-11-14 DIAGNOSIS — G4700 Insomnia, unspecified: Secondary | ICD-10-CM | POA: Diagnosis not present

## 2018-11-22 DIAGNOSIS — G40909 Epilepsy, unspecified, not intractable, without status epilepticus: Secondary | ICD-10-CM | POA: Diagnosis not present

## 2018-11-22 DIAGNOSIS — I739 Peripheral vascular disease, unspecified: Secondary | ICD-10-CM | POA: Diagnosis not present

## 2018-11-22 DIAGNOSIS — I724 Aneurysm of artery of lower extremity: Secondary | ICD-10-CM | POA: Diagnosis not present

## 2018-11-22 DIAGNOSIS — R29898 Other symptoms and signs involving the musculoskeletal system: Secondary | ICD-10-CM | POA: Diagnosis not present

## 2018-12-04 DIAGNOSIS — Z20828 Contact with and (suspected) exposure to other viral communicable diseases: Secondary | ICD-10-CM | POA: Diagnosis not present

## 2018-12-04 DIAGNOSIS — Z79899 Other long term (current) drug therapy: Secondary | ICD-10-CM | POA: Diagnosis not present

## 2018-12-04 DIAGNOSIS — I724 Aneurysm of artery of lower extremity: Secondary | ICD-10-CM | POA: Diagnosis not present

## 2018-12-04 DIAGNOSIS — Z01812 Encounter for preprocedural laboratory examination: Secondary | ICD-10-CM | POA: Diagnosis not present

## 2018-12-04 DIAGNOSIS — I70213 Atherosclerosis of native arteries of extremities with intermittent claudication, bilateral legs: Secondary | ICD-10-CM | POA: Diagnosis not present

## 2018-12-04 DIAGNOSIS — Z7902 Long term (current) use of antithrombotics/antiplatelets: Secondary | ICD-10-CM | POA: Diagnosis not present

## 2018-12-04 DIAGNOSIS — I129 Hypertensive chronic kidney disease with stage 1 through stage 4 chronic kidney disease, or unspecified chronic kidney disease: Secondary | ICD-10-CM | POA: Diagnosis not present

## 2018-12-04 DIAGNOSIS — F17210 Nicotine dependence, cigarettes, uncomplicated: Secondary | ICD-10-CM | POA: Diagnosis not present

## 2018-12-04 DIAGNOSIS — I714 Abdominal aortic aneurysm, without rupture: Secondary | ICD-10-CM | POA: Diagnosis not present

## 2018-12-04 DIAGNOSIS — N183 Chronic kidney disease, stage 3 (moderate): Secondary | ICD-10-CM | POA: Diagnosis not present

## 2018-12-04 DIAGNOSIS — E7800 Pure hypercholesterolemia, unspecified: Secondary | ICD-10-CM | POA: Diagnosis not present

## 2018-12-04 DIAGNOSIS — I70203 Unspecified atherosclerosis of native arteries of extremities, bilateral legs: Secondary | ICD-10-CM | POA: Diagnosis not present

## 2018-12-08 DIAGNOSIS — I70213 Atherosclerosis of native arteries of extremities with intermittent claudication, bilateral legs: Secondary | ICD-10-CM | POA: Diagnosis not present

## 2018-12-08 DIAGNOSIS — Z01812 Encounter for preprocedural laboratory examination: Secondary | ICD-10-CM | POA: Diagnosis not present

## 2018-12-09 DIAGNOSIS — I70212 Atherosclerosis of native arteries of extremities with intermittent claudication, left leg: Secondary | ICD-10-CM | POA: Diagnosis not present

## 2018-12-09 DIAGNOSIS — I724 Aneurysm of artery of lower extremity: Secondary | ICD-10-CM | POA: Diagnosis not present

## 2018-12-09 DIAGNOSIS — N183 Chronic kidney disease, stage 3 (moderate): Secondary | ICD-10-CM | POA: Diagnosis not present

## 2018-12-09 DIAGNOSIS — E1122 Type 2 diabetes mellitus with diabetic chronic kidney disease: Secondary | ICD-10-CM | POA: Diagnosis not present

## 2018-12-09 DIAGNOSIS — I70213 Atherosclerosis of native arteries of extremities with intermittent claudication, bilateral legs: Secondary | ICD-10-CM | POA: Diagnosis not present

## 2018-12-09 DIAGNOSIS — E1151 Type 2 diabetes mellitus with diabetic peripheral angiopathy without gangrene: Secondary | ICD-10-CM | POA: Diagnosis not present

## 2018-12-09 DIAGNOSIS — I129 Hypertensive chronic kidney disease with stage 1 through stage 4 chronic kidney disease, or unspecified chronic kidney disease: Secondary | ICD-10-CM | POA: Diagnosis not present

## 2018-12-09 DIAGNOSIS — E7800 Pure hypercholesterolemia, unspecified: Secondary | ICD-10-CM | POA: Diagnosis not present

## 2018-12-09 DIAGNOSIS — E785 Hyperlipidemia, unspecified: Secondary | ICD-10-CM | POA: Diagnosis not present

## 2018-12-09 DIAGNOSIS — I2510 Atherosclerotic heart disease of native coronary artery without angina pectoris: Secondary | ICD-10-CM | POA: Diagnosis not present

## 2018-12-09 DIAGNOSIS — I714 Abdominal aortic aneurysm, without rupture: Secondary | ICD-10-CM | POA: Diagnosis not present

## 2018-12-11 DIAGNOSIS — R011 Cardiac murmur, unspecified: Secondary | ICD-10-CM | POA: Diagnosis not present

## 2018-12-11 DIAGNOSIS — Z01818 Encounter for other preprocedural examination: Secondary | ICD-10-CM | POA: Diagnosis not present

## 2018-12-11 DIAGNOSIS — I252 Old myocardial infarction: Secondary | ICD-10-CM | POA: Diagnosis not present

## 2018-12-11 DIAGNOSIS — I724 Aneurysm of artery of lower extremity: Secondary | ICD-10-CM | POA: Diagnosis not present

## 2018-12-11 DIAGNOSIS — I6523 Occlusion and stenosis of bilateral carotid arteries: Secondary | ICD-10-CM | POA: Diagnosis not present

## 2018-12-11 DIAGNOSIS — I25810 Atherosclerosis of coronary artery bypass graft(s) without angina pectoris: Secondary | ICD-10-CM | POA: Diagnosis not present

## 2018-12-11 DIAGNOSIS — I340 Nonrheumatic mitral (valve) insufficiency: Secondary | ICD-10-CM | POA: Diagnosis not present

## 2018-12-11 DIAGNOSIS — I10 Essential (primary) hypertension: Secondary | ICD-10-CM | POA: Diagnosis not present

## 2018-12-11 DIAGNOSIS — I2510 Atherosclerotic heart disease of native coronary artery without angina pectoris: Secondary | ICD-10-CM | POA: Diagnosis not present

## 2018-12-12 DIAGNOSIS — R001 Bradycardia, unspecified: Secondary | ICD-10-CM | POA: Diagnosis not present

## 2018-12-12 DIAGNOSIS — R9431 Abnormal electrocardiogram [ECG] [EKG]: Secondary | ICD-10-CM | POA: Diagnosis not present

## 2018-12-19 DIAGNOSIS — Z1159 Encounter for screening for other viral diseases: Secondary | ICD-10-CM | POA: Diagnosis not present

## 2018-12-19 DIAGNOSIS — Z6832 Body mass index (BMI) 32.0-32.9, adult: Secondary | ICD-10-CM | POA: Diagnosis not present

## 2018-12-22 DIAGNOSIS — I724 Aneurysm of artery of lower extremity: Secondary | ICD-10-CM | POA: Diagnosis not present

## 2018-12-22 DIAGNOSIS — R29898 Other symptoms and signs involving the musculoskeletal system: Secondary | ICD-10-CM | POA: Diagnosis not present

## 2018-12-22 DIAGNOSIS — I739 Peripheral vascular disease, unspecified: Secondary | ICD-10-CM | POA: Diagnosis not present

## 2018-12-22 DIAGNOSIS — G40909 Epilepsy, unspecified, not intractable, without status epilepticus: Secondary | ICD-10-CM | POA: Diagnosis not present

## 2018-12-23 DIAGNOSIS — I517 Cardiomegaly: Secondary | ICD-10-CM | POA: Diagnosis not present

## 2018-12-23 DIAGNOSIS — I340 Nonrheumatic mitral (valve) insufficiency: Secondary | ICD-10-CM | POA: Diagnosis not present

## 2018-12-31 DIAGNOSIS — Z95820 Peripheral vascular angioplasty status with implants and grafts: Secondary | ICD-10-CM | POA: Diagnosis not present

## 2018-12-31 DIAGNOSIS — I70213 Atherosclerosis of native arteries of extremities with intermittent claudication, bilateral legs: Secondary | ICD-10-CM | POA: Diagnosis not present

## 2018-12-31 DIAGNOSIS — Z95828 Presence of other vascular implants and grafts: Secondary | ICD-10-CM | POA: Diagnosis not present

## 2019-01-07 DIAGNOSIS — G4700 Insomnia, unspecified: Secondary | ICD-10-CM | POA: Diagnosis not present

## 2019-01-07 DIAGNOSIS — F419 Anxiety disorder, unspecified: Secondary | ICD-10-CM | POA: Diagnosis not present

## 2019-01-07 DIAGNOSIS — I10 Essential (primary) hypertension: Secondary | ICD-10-CM | POA: Diagnosis not present

## 2019-01-09 DIAGNOSIS — I724 Aneurysm of artery of lower extremity: Secondary | ICD-10-CM | POA: Diagnosis not present

## 2019-01-09 DIAGNOSIS — Z01812 Encounter for preprocedural laboratory examination: Secondary | ICD-10-CM | POA: Diagnosis not present

## 2019-01-09 DIAGNOSIS — Z20828 Contact with and (suspected) exposure to other viral communicable diseases: Secondary | ICD-10-CM | POA: Diagnosis not present

## 2019-01-15 DIAGNOSIS — G609 Hereditary and idiopathic neuropathy, unspecified: Secondary | ICD-10-CM | POA: Diagnosis not present

## 2019-01-15 DIAGNOSIS — F17210 Nicotine dependence, cigarettes, uncomplicated: Secondary | ICD-10-CM | POA: Diagnosis not present

## 2019-01-15 DIAGNOSIS — Z7902 Long term (current) use of antithrombotics/antiplatelets: Secondary | ICD-10-CM | POA: Diagnosis not present

## 2019-01-15 DIAGNOSIS — G8918 Other acute postprocedural pain: Secondary | ICD-10-CM | POA: Diagnosis not present

## 2019-01-15 DIAGNOSIS — F250 Schizoaffective disorder, bipolar type: Secondary | ICD-10-CM | POA: Diagnosis not present

## 2019-01-15 DIAGNOSIS — Z7982 Long term (current) use of aspirin: Secondary | ICD-10-CM | POA: Diagnosis not present

## 2019-01-15 DIAGNOSIS — E1151 Type 2 diabetes mellitus with diabetic peripheral angiopathy without gangrene: Secondary | ICD-10-CM | POA: Diagnosis not present

## 2019-01-15 DIAGNOSIS — D62 Acute posthemorrhagic anemia: Secondary | ICD-10-CM | POA: Diagnosis not present

## 2019-01-15 DIAGNOSIS — G40409 Other generalized epilepsy and epileptic syndromes, not intractable, without status epilepticus: Secondary | ICD-10-CM | POA: Diagnosis not present

## 2019-01-15 DIAGNOSIS — E785 Hyperlipidemia, unspecified: Secondary | ICD-10-CM | POA: Diagnosis not present

## 2019-01-15 DIAGNOSIS — I1311 Hypertensive heart and chronic kidney disease without heart failure, with stage 5 chronic kidney disease, or end stage renal disease: Secondary | ICD-10-CM | POA: Diagnosis not present

## 2019-01-15 DIAGNOSIS — Z8249 Family history of ischemic heart disease and other diseases of the circulatory system: Secondary | ICD-10-CM | POA: Diagnosis not present

## 2019-01-15 DIAGNOSIS — E1122 Type 2 diabetes mellitus with diabetic chronic kidney disease: Secondary | ICD-10-CM | POA: Diagnosis not present

## 2019-01-15 DIAGNOSIS — N186 End stage renal disease: Secondary | ICD-10-CM | POA: Diagnosis not present

## 2019-01-15 DIAGNOSIS — I724 Aneurysm of artery of lower extremity: Secondary | ICD-10-CM | POA: Diagnosis not present

## 2019-01-15 DIAGNOSIS — J45909 Unspecified asthma, uncomplicated: Secondary | ICD-10-CM | POA: Diagnosis not present

## 2019-01-15 DIAGNOSIS — I252 Old myocardial infarction: Secondary | ICD-10-CM | POA: Diagnosis not present

## 2019-01-15 DIAGNOSIS — I714 Abdominal aortic aneurysm, without rupture: Secondary | ICD-10-CM | POA: Diagnosis not present

## 2019-01-15 DIAGNOSIS — I25810 Atherosclerosis of coronary artery bypass graft(s) without angina pectoris: Secondary | ICD-10-CM | POA: Diagnosis not present

## 2019-01-15 DIAGNOSIS — Z823 Family history of stroke: Secondary | ICD-10-CM | POA: Diagnosis not present

## 2019-01-15 DIAGNOSIS — F1010 Alcohol abuse, uncomplicated: Secondary | ICD-10-CM | POA: Diagnosis not present

## 2019-01-15 DIAGNOSIS — I70222 Atherosclerosis of native arteries of extremities with rest pain, left leg: Secondary | ICD-10-CM | POA: Diagnosis not present

## 2019-01-15 DIAGNOSIS — F339 Major depressive disorder, recurrent, unspecified: Secondary | ICD-10-CM | POA: Diagnosis not present

## 2019-01-15 DIAGNOSIS — I2510 Atherosclerotic heart disease of native coronary artery without angina pectoris: Secondary | ICD-10-CM | POA: Diagnosis not present

## 2019-01-15 DIAGNOSIS — Z825 Family history of asthma and other chronic lower respiratory diseases: Secondary | ICD-10-CM | POA: Diagnosis not present

## 2019-01-16 DIAGNOSIS — I724 Aneurysm of artery of lower extremity: Secondary | ICD-10-CM | POA: Diagnosis not present

## 2019-01-22 DIAGNOSIS — G40909 Epilepsy, unspecified, not intractable, without status epilepticus: Secondary | ICD-10-CM | POA: Diagnosis not present

## 2019-01-22 DIAGNOSIS — R29898 Other symptoms and signs involving the musculoskeletal system: Secondary | ICD-10-CM | POA: Diagnosis not present

## 2019-01-22 DIAGNOSIS — I739 Peripheral vascular disease, unspecified: Secondary | ICD-10-CM | POA: Diagnosis not present

## 2019-01-22 DIAGNOSIS — I724 Aneurysm of artery of lower extremity: Secondary | ICD-10-CM | POA: Diagnosis not present

## 2019-02-04 DIAGNOSIS — M47819 Spondylosis without myelopathy or radiculopathy, site unspecified: Secondary | ICD-10-CM | POA: Diagnosis not present

## 2019-02-04 DIAGNOSIS — I6783 Posterior reversible encephalopathy syndrome: Secondary | ICD-10-CM | POA: Diagnosis not present

## 2019-02-04 DIAGNOSIS — G40909 Epilepsy, unspecified, not intractable, without status epilepticus: Secondary | ICD-10-CM | POA: Diagnosis not present

## 2019-02-09 DIAGNOSIS — I10 Essential (primary) hypertension: Secondary | ICD-10-CM | POA: Diagnosis not present

## 2019-02-09 DIAGNOSIS — I70213 Atherosclerosis of native arteries of extremities with intermittent claudication, bilateral legs: Secondary | ICD-10-CM | POA: Diagnosis not present

## 2019-02-17 DIAGNOSIS — I739 Peripheral vascular disease, unspecified: Secondary | ICD-10-CM | POA: Diagnosis not present

## 2019-02-17 DIAGNOSIS — R29898 Other symptoms and signs involving the musculoskeletal system: Secondary | ICD-10-CM | POA: Diagnosis not present

## 2019-02-17 DIAGNOSIS — I724 Aneurysm of artery of lower extremity: Secondary | ICD-10-CM | POA: Diagnosis not present

## 2019-02-17 DIAGNOSIS — G40909 Epilepsy, unspecified, not intractable, without status epilepticus: Secondary | ICD-10-CM | POA: Diagnosis not present

## 2019-02-21 DIAGNOSIS — I724 Aneurysm of artery of lower extremity: Secondary | ICD-10-CM | POA: Diagnosis not present

## 2019-02-21 DIAGNOSIS — G40909 Epilepsy, unspecified, not intractable, without status epilepticus: Secondary | ICD-10-CM | POA: Diagnosis not present

## 2019-02-21 DIAGNOSIS — I739 Peripheral vascular disease, unspecified: Secondary | ICD-10-CM | POA: Diagnosis not present

## 2019-02-21 DIAGNOSIS — R29898 Other symptoms and signs involving the musculoskeletal system: Secondary | ICD-10-CM | POA: Diagnosis not present

## 2019-03-10 DIAGNOSIS — F419 Anxiety disorder, unspecified: Secondary | ICD-10-CM | POA: Diagnosis not present

## 2019-03-10 DIAGNOSIS — I10 Essential (primary) hypertension: Secondary | ICD-10-CM | POA: Diagnosis not present

## 2019-03-10 DIAGNOSIS — G4700 Insomnia, unspecified: Secondary | ICD-10-CM | POA: Diagnosis not present

## 2019-03-10 DIAGNOSIS — E7800 Pure hypercholesterolemia, unspecified: Secondary | ICD-10-CM | POA: Diagnosis not present

## 2019-04-02 DIAGNOSIS — I10 Essential (primary) hypertension: Secondary | ICD-10-CM | POA: Diagnosis not present

## 2019-04-02 DIAGNOSIS — R809 Proteinuria, unspecified: Secondary | ICD-10-CM | POA: Diagnosis not present

## 2019-04-02 DIAGNOSIS — N183 Chronic kidney disease, stage 3 unspecified: Secondary | ICD-10-CM | POA: Diagnosis not present

## 2019-06-10 DIAGNOSIS — I10 Essential (primary) hypertension: Secondary | ICD-10-CM | POA: Diagnosis not present

## 2019-06-10 DIAGNOSIS — Z79899 Other long term (current) drug therapy: Secondary | ICD-10-CM | POA: Diagnosis not present

## 2019-06-10 DIAGNOSIS — E7800 Pure hypercholesterolemia, unspecified: Secondary | ICD-10-CM | POA: Diagnosis not present

## 2019-06-10 DIAGNOSIS — G4700 Insomnia, unspecified: Secondary | ICD-10-CM | POA: Diagnosis not present

## 2019-06-10 DIAGNOSIS — F419 Anxiety disorder, unspecified: Secondary | ICD-10-CM | POA: Diagnosis not present

## 2019-07-08 DIAGNOSIS — N183 Chronic kidney disease, stage 3 unspecified: Secondary | ICD-10-CM | POA: Diagnosis not present

## 2019-07-08 DIAGNOSIS — R809 Proteinuria, unspecified: Secondary | ICD-10-CM | POA: Diagnosis not present

## 2019-07-08 DIAGNOSIS — I10 Essential (primary) hypertension: Secondary | ICD-10-CM | POA: Diagnosis not present

## 2019-07-10 DIAGNOSIS — N183 Chronic kidney disease, stage 3 unspecified: Secondary | ICD-10-CM | POA: Diagnosis not present

## 2019-07-10 DIAGNOSIS — I10 Essential (primary) hypertension: Secondary | ICD-10-CM | POA: Diagnosis not present

## 2019-07-10 DIAGNOSIS — N390 Urinary tract infection, site not specified: Secondary | ICD-10-CM | POA: Diagnosis not present

## 2019-09-02 DIAGNOSIS — G4700 Insomnia, unspecified: Secondary | ICD-10-CM | POA: Diagnosis not present

## 2019-09-02 DIAGNOSIS — J45909 Unspecified asthma, uncomplicated: Secondary | ICD-10-CM | POA: Diagnosis not present

## 2019-09-02 DIAGNOSIS — E7800 Pure hypercholesterolemia, unspecified: Secondary | ICD-10-CM | POA: Diagnosis not present

## 2019-09-02 DIAGNOSIS — I10 Essential (primary) hypertension: Secondary | ICD-10-CM | POA: Diagnosis not present

## 2019-09-14 DIAGNOSIS — G40409 Other generalized epilepsy and epileptic syndromes, not intractable, without status epilepticus: Secondary | ICD-10-CM | POA: Diagnosis not present

## 2019-09-14 DIAGNOSIS — I499 Cardiac arrhythmia, unspecified: Secondary | ICD-10-CM | POA: Diagnosis not present

## 2019-09-14 DIAGNOSIS — I25708 Atherosclerosis of coronary artery bypass graft(s), unspecified, with other forms of angina pectoris: Secondary | ICD-10-CM | POA: Diagnosis not present

## 2019-09-14 DIAGNOSIS — Z8249 Family history of ischemic heart disease and other diseases of the circulatory system: Secondary | ICD-10-CM | POA: Diagnosis not present

## 2019-09-14 DIAGNOSIS — I252 Old myocardial infarction: Secondary | ICD-10-CM | POA: Diagnosis not present

## 2019-09-14 DIAGNOSIS — Z7902 Long term (current) use of antithrombotics/antiplatelets: Secondary | ICD-10-CM | POA: Diagnosis not present

## 2019-09-14 DIAGNOSIS — G40909 Epilepsy, unspecified, not intractable, without status epilepticus: Secondary | ICD-10-CM | POA: Diagnosis not present

## 2019-09-14 DIAGNOSIS — I25810 Atherosclerosis of coronary artery bypass graft(s) without angina pectoris: Secondary | ICD-10-CM | POA: Diagnosis not present

## 2019-09-14 DIAGNOSIS — R9401 Abnormal electroencephalogram [EEG]: Secondary | ICD-10-CM | POA: Diagnosis not present

## 2019-09-14 DIAGNOSIS — I82449 Acute embolism and thrombosis of unspecified tibial vein: Secondary | ICD-10-CM | POA: Diagnosis not present

## 2019-09-14 DIAGNOSIS — I9589 Other hypotension: Secondary | ICD-10-CM | POA: Diagnosis not present

## 2019-09-14 DIAGNOSIS — R935 Abnormal findings on diagnostic imaging of other abdominal regions, including retroperitoneum: Secondary | ICD-10-CM | POA: Diagnosis not present

## 2019-09-14 DIAGNOSIS — I491 Atrial premature depolarization: Secondary | ICD-10-CM | POA: Diagnosis not present

## 2019-09-14 DIAGNOSIS — Z4682 Encounter for fitting and adjustment of non-vascular catheter: Secondary | ICD-10-CM | POA: Diagnosis not present

## 2019-09-14 DIAGNOSIS — E7800 Pure hypercholesterolemia, unspecified: Secondary | ICD-10-CM | POA: Diagnosis not present

## 2019-09-14 DIAGNOSIS — I469 Cardiac arrest, cause unspecified: Secondary | ICD-10-CM | POA: Diagnosis not present

## 2019-09-14 DIAGNOSIS — I454 Nonspecific intraventricular block: Secondary | ICD-10-CM | POA: Diagnosis not present

## 2019-09-14 DIAGNOSIS — J90 Pleural effusion, not elsewhere classified: Secondary | ICD-10-CM | POA: Diagnosis not present

## 2019-09-14 DIAGNOSIS — E785 Hyperlipidemia, unspecified: Secondary | ICD-10-CM | POA: Diagnosis not present

## 2019-09-14 DIAGNOSIS — J9811 Atelectasis: Secondary | ICD-10-CM | POA: Diagnosis not present

## 2019-09-14 DIAGNOSIS — I213 ST elevation (STEMI) myocardial infarction of unspecified site: Secondary | ICD-10-CM | POA: Diagnosis not present

## 2019-09-14 DIAGNOSIS — I10 Essential (primary) hypertension: Secondary | ICD-10-CM | POA: Diagnosis not present

## 2019-09-14 DIAGNOSIS — N183 Chronic kidney disease, stage 3 unspecified: Secondary | ICD-10-CM | POA: Diagnosis not present

## 2019-09-14 DIAGNOSIS — J811 Chronic pulmonary edema: Secondary | ICD-10-CM | POA: Diagnosis not present

## 2019-09-14 DIAGNOSIS — G931 Anoxic brain damage, not elsewhere classified: Secondary | ICD-10-CM | POA: Diagnosis not present

## 2019-09-14 DIAGNOSIS — I472 Ventricular tachycardia: Secondary | ICD-10-CM | POA: Diagnosis not present

## 2019-09-14 DIAGNOSIS — I82451 Acute embolism and thrombosis of right peroneal vein: Secondary | ICD-10-CM | POA: Diagnosis not present

## 2019-09-14 DIAGNOSIS — I129 Hypertensive chronic kidney disease with stage 1 through stage 4 chronic kidney disease, or unspecified chronic kidney disease: Secondary | ICD-10-CM | POA: Diagnosis not present

## 2019-09-14 DIAGNOSIS — Z8673 Personal history of transient ischemic attack (TIA), and cerebral infarction without residual deficits: Secondary | ICD-10-CM | POA: Diagnosis not present

## 2019-09-14 DIAGNOSIS — Z66 Do not resuscitate: Secondary | ICD-10-CM | POA: Diagnosis not present

## 2019-09-14 DIAGNOSIS — J9600 Acute respiratory failure, unspecified whether with hypoxia or hypercapnia: Secondary | ICD-10-CM | POA: Diagnosis not present

## 2019-09-14 DIAGNOSIS — I82441 Acute embolism and thrombosis of right tibial vein: Secondary | ICD-10-CM | POA: Diagnosis not present

## 2019-09-14 DIAGNOSIS — I25118 Atherosclerotic heart disease of native coronary artery with other forms of angina pectoris: Secondary | ICD-10-CM | POA: Diagnosis not present

## 2019-09-14 DIAGNOSIS — J9601 Acute respiratory failure with hypoxia: Secondary | ICD-10-CM | POA: Diagnosis not present

## 2019-09-14 DIAGNOSIS — R404 Transient alteration of awareness: Secondary | ICD-10-CM | POA: Diagnosis not present

## 2019-09-14 DIAGNOSIS — G936 Cerebral edema: Secondary | ICD-10-CM | POA: Diagnosis not present

## 2019-09-14 DIAGNOSIS — Z951 Presence of aortocoronary bypass graft: Secondary | ICD-10-CM | POA: Diagnosis not present

## 2019-09-14 DIAGNOSIS — Z7982 Long term (current) use of aspirin: Secondary | ICD-10-CM | POA: Diagnosis not present

## 2019-09-14 DIAGNOSIS — I2510 Atherosclerotic heart disease of native coronary artery without angina pectoris: Secondary | ICD-10-CM | POA: Diagnosis not present

## 2019-09-14 DIAGNOSIS — R0689 Other abnormalities of breathing: Secondary | ICD-10-CM | POA: Diagnosis not present

## 2019-09-14 DIAGNOSIS — Z95820 Peripheral vascular angioplasty status with implants and grafts: Secondary | ICD-10-CM | POA: Diagnosis not present

## 2019-09-14 DIAGNOSIS — R7989 Other specified abnormal findings of blood chemistry: Secondary | ICD-10-CM | POA: Diagnosis not present

## 2019-09-14 DIAGNOSIS — I739 Peripheral vascular disease, unspecified: Secondary | ICD-10-CM | POA: Diagnosis not present

## 2019-09-14 DIAGNOSIS — G9389 Other specified disorders of brain: Secondary | ICD-10-CM | POA: Diagnosis not present

## 2019-09-14 DIAGNOSIS — E1122 Type 2 diabetes mellitus with diabetic chronic kidney disease: Secondary | ICD-10-CM | POA: Diagnosis not present

## 2019-09-14 DIAGNOSIS — I340 Nonrheumatic mitral (valve) insufficiency: Secondary | ICD-10-CM | POA: Diagnosis not present

## 2019-09-14 DIAGNOSIS — Z20822 Contact with and (suspected) exposure to covid-19: Secondary | ICD-10-CM | POA: Diagnosis not present

## 2019-09-14 DIAGNOSIS — R0602 Shortness of breath: Secondary | ICD-10-CM | POA: Diagnosis not present

## 2019-09-14 DIAGNOSIS — I4901 Ventricular fibrillation: Secondary | ICD-10-CM | POA: Diagnosis not present

## 2019-09-14 DIAGNOSIS — I82431 Acute embolism and thrombosis of right popliteal vein: Secondary | ICD-10-CM | POA: Diagnosis not present

## 2019-09-14 DIAGNOSIS — E119 Type 2 diabetes mellitus without complications: Secondary | ICD-10-CM | POA: Diagnosis not present

## 2019-09-14 DIAGNOSIS — F17210 Nicotine dependence, cigarettes, uncomplicated: Secondary | ICD-10-CM | POA: Diagnosis not present

## 2019-09-14 DIAGNOSIS — Z823 Family history of stroke: Secondary | ICD-10-CM | POA: Diagnosis not present

## 2019-09-14 DIAGNOSIS — G253 Myoclonus: Secondary | ICD-10-CM | POA: Diagnosis not present

## 2019-09-14 DIAGNOSIS — I219 Acute myocardial infarction, unspecified: Secondary | ICD-10-CM | POA: Diagnosis not present

## 2019-09-14 DIAGNOSIS — F250 Schizoaffective disorder, bipolar type: Secondary | ICD-10-CM | POA: Diagnosis not present

## 2019-10-18 DEATH — deceased
